# Patient Record
Sex: Female | Born: 1980 | Race: White | Hispanic: No | Marital: Single | Smoking: Former smoker
Health system: Southern US, Community
[De-identification: ages and names within clinical notes are randomized; demographics above are authoritative.]

## PROBLEM LIST (undated history)

## (undated) DIAGNOSIS — O139 Gestational [pregnancy-induced] hypertension without significant proteinuria, unspecified trimester: Secondary | ICD-10-CM

## (undated) HISTORY — DX: Gestational (pregnancy-induced) hypertension without significant proteinuria, unspecified trimester: O13.9

## (undated) HISTORY — PX: NO PAST SURGERIES: SHX2092

---

## 2001-03-12 ENCOUNTER — Other Ambulatory Visit: Admission: RE | Admit: 2001-03-12 | Discharge: 2001-03-12 | Payer: Self-pay | Admitting: Obstetrics and Gynecology

## 2002-06-02 ENCOUNTER — Other Ambulatory Visit: Admission: RE | Admit: 2002-06-02 | Discharge: 2002-06-02 | Payer: Self-pay | Admitting: Obstetrics and Gynecology

## 2003-03-30 ENCOUNTER — Other Ambulatory Visit: Admission: RE | Admit: 2003-03-30 | Discharge: 2003-03-30 | Payer: Self-pay | Admitting: Obstetrics and Gynecology

## 2004-03-27 ENCOUNTER — Other Ambulatory Visit: Admission: RE | Admit: 2004-03-27 | Discharge: 2004-03-27 | Payer: Self-pay | Admitting: Obstetrics and Gynecology

## 2009-10-16 ENCOUNTER — Emergency Department (HOSPITAL_COMMUNITY): Admission: EM | Admit: 2009-10-16 | Discharge: 2009-10-16 | Payer: Self-pay | Admitting: Emergency Medicine

## 2011-06-12 ENCOUNTER — Emergency Department (INDEPENDENT_AMBULATORY_CARE_PROVIDER_SITE_OTHER): Payer: Self-pay

## 2011-06-12 ENCOUNTER — Emergency Department (HOSPITAL_BASED_OUTPATIENT_CLINIC_OR_DEPARTMENT_OTHER)
Admission: EM | Admit: 2011-06-12 | Discharge: 2011-06-12 | Disposition: A | Discharge status: Home / Self Care | Payer: Self-pay | Attending: Emergency Medicine | Admitting: Emergency Medicine

## 2011-06-12 ENCOUNTER — Encounter: Payer: Self-pay | Admitting: *Deleted

## 2011-06-12 DIAGNOSIS — M25539 Pain in unspecified wrist: Secondary | ICD-10-CM

## 2011-06-12 DIAGNOSIS — S52043A Displaced fracture of coronoid process of unspecified ulna, initial encounter for closed fracture: Secondary | ICD-10-CM | POA: Insufficient documentation

## 2011-06-12 DIAGNOSIS — S52539A Colles' fracture of unspecified radius, initial encounter for closed fracture: Secondary | ICD-10-CM

## 2011-06-12 DIAGNOSIS — S52123A Displaced fracture of head of unspecified radius, initial encounter for closed fracture: Secondary | ICD-10-CM

## 2011-06-12 DIAGNOSIS — W19XXXA Unspecified fall, initial encounter: Secondary | ICD-10-CM | POA: Insufficient documentation

## 2011-06-12 DIAGNOSIS — R609 Edema, unspecified: Secondary | ICD-10-CM

## 2011-06-12 DIAGNOSIS — F17200 Nicotine dependence, unspecified, uncomplicated: Secondary | ICD-10-CM | POA: Insufficient documentation

## 2011-06-12 MED ORDER — HYDROCODONE-ACETAMINOPHEN 5-325 MG PO TABS: 2.0000 | ORAL_TABLET | ORAL | Status: AC | PRN

## 2011-06-12 NOTE — ED Notes (Signed)
Playing kickball last pm and fell backward. Inj to her right wrist and left elbow. Swelling and pain to her wrist and pain to her elbow. Has been taking Advil and using ice on affected sites.

## 2011-06-12 NOTE — ED Provider Notes (Signed)
History     CSN: [id number] Arrival date & time: 06/12/2011 11:52 AM   Chief Complaint  Patient presents with  . Wrist Pain     (Include location/radiation/quality/duration/timing/severity/associated sxs/prior treatment) Patient is a 30 y.o. female presenting with wrist pain.  Wrist Pain This is a new problem. The current episode started yesterday. The problem occurs constantly. The problem has been unchanged. Associated symptoms include joint swelling. Pertinent negatives include no numbness or weakness. The symptoms are aggravated by bending. She has tried immobilization for the symptoms. The treatment provided moderate relief.     History reviewed. No pertinent past medical history.   History reviewed. No pertinent past surgical history.  No family history on file.  History  Substance Use Topics  . Smoking status: Current Everyday Smoker -- 0.5 packs/day  . Smokeless tobacco: Not on file  . Alcohol Use: Yes    OB History    Grav Para Term Preterm Abortions TAB SAB Ect Mult Living                  Review of Systems  Musculoskeletal: Positive for joint swelling.  Neurological: Negative for weakness and numbness.  All other systems reviewed and are negative.    Allergies  Review of patient's allergies indicates no known allergies.  Home Medications  No current outpatient prescriptions on file.  Physical Exam    BP 127/72  Pulse 103  Temp(Src) 98.2 F (36.8 C) (Oral)  Resp 20  SpO2 100%  Physical Exam  Nursing note and vitals reviewed. Constitutional: She appears well-developed and well-nourished.  HENT:  Head: Normocephalic.  Eyes: Pupils are equal, round, and reactive to light.  Musculoskeletal: She exhibits edema and tenderness.       Decreased range of motion.  nv and ns intact  Neurological: She is alert.  Skin: Skin is warm and dry.  Psychiatric: She has a normal mood and affect.    ED Course  Procedures  No results found for this or  any previous visit. Dg Wrist Complete Right  06/12/2011  *RADIOLOGY REPORT*  Clinical Data: Fall, right wrist pain and swelling  RIGHT WRIST - COMPLETE 3+ VIEW  Comparison: None.  Findings: Mild right wrist soft tissue swelling.  Nondisplaced radiolucent fracture line visible of the right distal radius extending into the intra-articular surface.  Distal ulna and carpal bones appear intact.  IMPRESSION: Nondisplaced right distal radius intra-articular fracture.  Original Report Authenticated By: Judie Petit. Ruel Favors, M.D.     No diagnosis found.   MDM Pt counseled on fx.  I advised followup with Dr. Pearletha Forge for recheck.       Langston Masker, [place] 06/12/11 1353

## 2011-06-13 NOTE — ED Provider Notes (Signed)
History     CSN: [id number] Arrival date & time: 06/12/2011 11:52 AM   Chief Complaint  Patient presents with  . Wrist Pain     (Include location/radiation/quality/duration/timing/severity/associated sxs/prior treatment) HPI   History reviewed. No pertinent past medical history.   History reviewed. No pertinent past surgical history.  No family history on file.  History  Substance Use Topics  . Smoking status: Current Everyday Smoker -- 0.5 packs/day  . Smokeless tobacco: Not on file  . Alcohol Use: Yes    OB History    Grav Para Term Preterm Abortions TAB SAB Ect Mult Living                  Review of Systems  Allergies  Review of patient's allergies indicates no known allergies.  Home Medications   Current Outpatient Rx  Name Route Sig Dispense Refill  . HYDROCODONE-ACETAMINOPHEN 5-325 MG PO TABS Oral Take 2 tablets by mouth every 4 (four) hours as needed for pain. 20 tablet 0    Physical Exam    BP 127/72  Pulse 103  Temp(Src) 98.2 F (36.8 C) (Oral)  Resp 20  SpO2 100%  Physical Exam  ED Course  Procedures  No results found for this or any previous visit. Dg Wrist Complete Right  06/12/2011  *RADIOLOGY REPORT*  Clinical Data: Fall, right wrist pain and swelling  RIGHT WRIST - COMPLETE 3+ VIEW  Comparison: None.  Findings: Mild right wrist soft tissue swelling.  Nondisplaced radiolucent fracture line visible of the right distal radius extending into the intra-articular surface.  Distal ulna and carpal bones appear intact.  IMPRESSION: Nondisplaced right distal radius intra-articular fracture.  Original Report Authenticated By: Judie Petit. Ruel Favors, M.D.     1. Closed fracture of head of radius      MDM Medical screening examination/treatment/procedure(s) were performed by non-physician practitioner and as supervising physician I was immediately available for consultation/collaboration.        Yamilette Garretson A. Patrica Duel, MD 06/13/11 [phone]

## 2011-06-17 ENCOUNTER — Ambulatory Visit (INDEPENDENT_AMBULATORY_CARE_PROVIDER_SITE_OTHER): Payer: Self-pay | Admitting: Family Medicine

## 2011-06-17 ENCOUNTER — Encounter: Payer: Self-pay | Admitting: Family Medicine

## 2011-06-17 VITALS — BP 114/71 | HR 103 | Temp 98.2°F | Ht 70.0 in | Wt 130.0 lb

## 2011-06-17 DIAGNOSIS — S6991XA Unspecified injury of right wrist, hand and finger(s), initial encounter: Secondary | ICD-10-CM | POA: Insufficient documentation

## 2011-06-17 DIAGNOSIS — S59909A Unspecified injury of unspecified elbow, initial encounter: Secondary | ICD-10-CM

## 2011-06-17 DIAGNOSIS — S6990XA Unspecified injury of unspecified wrist, hand and finger(s), initial encounter: Secondary | ICD-10-CM

## 2011-06-17 HISTORY — DX: Unspecified injury of right wrist, hand and finger(s), initial encounter: S69.91XA

## 2011-06-17 NOTE — Assessment & Plan Note (Signed)
Nondisplaced intraarticular right distal radius fracture - fracture fragment involves approximately 25% of radiocarpal joint.  No ulnar component.  This is nondisplaced and if holds its current position without exceeding 3 mm displacement, she should heal well with conservative treatment.  Will continue with splinting for total of 10-11 days then will return this coming Monday for splint removal, repeat radiographs to reassess.  If still holding position will transition to short arm cast at that time.  Icing, tylenol during day for pain - rx tramadol for evenings (no driving on this) instead of hydrocodone.  Elevation for swelling.  F/u on Monday.

## 2011-06-17 NOTE — Progress Notes (Signed)
  Subjective:    Patient ID: Emily Mccarthy, female    DOB: 01/15/1981, 30 y.o.   MRN: [id number]  PCP: Dr. [facility]  HPI 30 yo F here for follow up of left wrist injury.  Patient states she was playing kickball on evening of 9/12. She caught the ball but fell backwards - sustained a FOOSH injury to right wrist with this extended behind her. Had some pain initially but kept playing. Then when picked ball up on next play and threw it, felt pain much worse in her right wrist and stopped playing. + swelling over next day. Went to ED on 9/13 and diagnosed with nondisplaced intraarticular distal radius fracture - place in volar splint and advised to follow-up here. Has been icing, elevating. Hydrocodone makes her too sleepy so stopped taking and instead taking advil. Is right handed  History reviewed. No pertinent past medical history.  Current Outpatient Prescriptions on File Prior to Visit  Medication Sig Dispense Refill  . HYDROcodone-acetaminophen (NORCO) 5-325 MG per tablet Take 2 tablets by mouth every 4 (four) hours as needed for pain.  20 tablet  0    History reviewed. No pertinent past surgical history.  No Known Allergies  History   Social History  . Marital Status: Single    Spouse Name: N/A    Number of Children: N/A  . Years of Education: N/A   Occupational History  . Not on file.   Social History Main Topics  . Smoking status: Current Everyday Smoker -- 0.5 packs/day  . Smokeless tobacco: Not on file  . Alcohol Use: Yes  . Drug Use: No  . Sexually Active:    Other Topics Concern  . Not on file   Social History Narrative  . No narrative on file    Family History  Problem Relation Age of Onset  . Heart attack Neg Hx   . Sudden death Neg Hx     BP 114/71  Pulse 103  Temp 98.2 F (36.8 C)  Ht 5\' 10"  (1.778 m)  Wt 130 lb (58.968 kg)  BMI 18.65 kg/m2  Review of Systems See HPI above.    Objective:   Physical Exam Gen: NAD R  wrist: Splint not removed for today's exam. Able to flex, extend, oppose thumb, abduct digits.  FROM elbow. Sensation intact to light touch and < 2 sec cap refill of digits. TTP distal radius.  No ulna or other TTP about wrist through splint.    Assessment & Plan:  1. Nondisplaced intraarticular right distal radius fracture - fracture fragment involves approximately 25% of radiocarpal joint.  No ulnar component.  This is nondisplaced and if holds its current position without exceeding 3 mm displacement, she should heal well with conservative treatment.  Will continue with splinting for total of 10-11 days then will return this coming Monday for splint removal, repeat radiographs to reassess.  If still holding position will transition to short arm cast at that time.  Icing, tylenol during day for pain - rx tramadol for evenings (no driving on this) instead of hydrocodone.  Elevation for swelling.  F/u on Monday.

## 2011-06-23 ENCOUNTER — Encounter: Payer: Self-pay | Admitting: Family Medicine

## 2011-06-23 ENCOUNTER — Ambulatory Visit (INDEPENDENT_AMBULATORY_CARE_PROVIDER_SITE_OTHER): Payer: Self-pay | Admitting: Family Medicine

## 2011-06-23 ENCOUNTER — Ambulatory Visit: Payer: Self-pay | Admitting: Family Medicine

## 2011-06-23 ENCOUNTER — Ambulatory Visit (HOSPITAL_BASED_OUTPATIENT_CLINIC_OR_DEPARTMENT_OTHER)
Admission: RE | Admit: 2011-06-23 | Discharge: 2011-06-23 | Disposition: A | Discharge status: Home / Self Care | Payer: Self-pay | Source: Ambulatory Visit | Attending: Family Medicine | Admitting: Family Medicine

## 2011-06-23 VITALS — BP 104/70 | HR 94 | Temp 98.3°F | Ht 70.0 in | Wt 130.0 lb

## 2011-06-23 DIAGNOSIS — M25529 Pain in unspecified elbow: Secondary | ICD-10-CM

## 2011-06-23 DIAGNOSIS — M25531 Pain in right wrist: Secondary | ICD-10-CM

## 2011-06-23 DIAGNOSIS — Z4789 Encounter for other orthopedic aftercare: Secondary | ICD-10-CM | POA: Insufficient documentation

## 2011-06-23 DIAGNOSIS — S6991XA Unspecified injury of right wrist, hand and finger(s), initial encounter: Secondary | ICD-10-CM

## 2011-06-23 DIAGNOSIS — M25522 Pain in left elbow: Secondary | ICD-10-CM

## 2011-06-23 DIAGNOSIS — M25539 Pain in unspecified wrist: Secondary | ICD-10-CM

## 2011-06-23 DIAGNOSIS — S6990XA Unspecified injury of unspecified wrist, hand and finger(s), initial encounter: Secondary | ICD-10-CM

## 2011-06-24 ENCOUNTER — Encounter: Payer: Self-pay | Admitting: Family Medicine

## 2011-06-24 DIAGNOSIS — M25522 Pain in left elbow: Secondary | ICD-10-CM | POA: Insufficient documentation

## 2011-06-24 HISTORY — DX: Pain in left elbow: M25.522

## 2011-06-24 NOTE — Assessment & Plan Note (Signed)
2/2 lateral epicondylitis.  Offered wrist brace, counterforce brace - she would like to wait on this and physical therapy.  Will reassess at followup.  Continue icing, tylenol/tramadol

## 2011-06-24 NOTE — Assessment & Plan Note (Signed)
Nondisplaced intraarticular right distal radius fracture - fracture fragment involves approximately 25% of radiocarpal joint.  No ulnar component.  Repeat radiographs show this is holding excellent position.  Transition today to short arm cast and anticipate total of 6 weeks of immobilization.  F/u in 2 weeks to remove cast, repeat x-rays.  Continue icing, tylenol during day for pain - rx tramadol for evenings (no driving on this). Elevation for swelling.

## 2011-06-24 NOTE — Progress Notes (Signed)
Subjective:    Patient ID: Emily Mccarthy, female    DOB: November 12, 1980, 30 y.o.   MRN: [id number]  PCP: Dr. [facility]  HPI  30 yo F here for follow up of left wrist injury.  9/18: Patient states she was playing kickball on evening of 9/12. She caught the ball but fell backwards - sustained a FOOSH injury to right wrist with this extended behind her. Had some pain initially but kept playing. Then when picked ball up on next play and threw it, felt pain much worse in her right wrist and stopped playing. + swelling over next day. Went to ED on 9/13 and diagnosed with nondisplaced intraarticular distal radius fracture - place in volar splint and advised to follow-up here. Has been icing, elevating. Hydrocodone makes her too sleepy so stopped taking and instead taking advil. Is right handed  9/24: Patient has done well with splint. Has been icing and now with very little pain in right arm. From using left arm more, is now getting lateral forearm and elbow pain without bruising or swelling. Pain worse with wrist extension. Has been icing this as well. No bruising lateral elbow - has some medially from her initial fall a couple weeks ago that is almost resolved. Takes tramadol in evenings as needed for pain.  History reviewed. No pertinent past medical history.  No current outpatient prescriptions on file prior to visit.    History reviewed. No pertinent past surgical history.  No Known Allergies  History   Social History  . Marital Status: Single    Spouse Name: N/A    Number of Children: N/A  . Years of Education: N/A   Occupational History  . Not on file.   Social History Main Topics  . Smoking status: Current Everyday Smoker -- 0.5 packs/day  . Smokeless tobacco: Not on file  . Alcohol Use: Yes  . Drug Use: No  . Sexually Active:    Other Topics Concern  . Not on file   Social History Narrative  . No narrative on file    Family History  Problem  Relation Age of Onset  . Heart attack Neg Hx   . Sudden death Neg Hx     BP 104/70  Pulse 94  Temp(Src) 98.3 F (36.8 C) (Oral)  Ht 5\' 10"  (1.778 m)  Wt 130 lb (58.968 kg)  BMI 18.65 kg/m2  Review of Systems  See HPI above.    Objective:   Physical Exam  Gen: NAD R wrist: Splint removed. Mild TTP distal radius.  No ulna or other TTP about right wrist/forearm/elbow. Did not test wrist motion 2/2 known fracture -aAble to flex, extend, oppose thumb, abduct digits.  FROM elbow. Sensation intact to light touch and < 2 sec cap refill of digits.  L elbow: No gross deformity, swelling, bruising laterally (small bruise medial upper arm). TTP just distal to lateral epicondyle and within extensor mass forearm.  No bony TTP. FROM elbow and wrist - pain on elbow and wrist extension near elbow. Collateral ligaments intact NVI distally.    Assessment & Plan:  1. Nondisplaced intraarticular right distal radius fracture - fracture fragment involves approximately 25% of radiocarpal joint.  No ulnar component.  Repeat radiographs show this is holding excellent position.  Transition today to short arm cast and anticipate total of 6 weeks of immobilization.  F/u in 2 weeks to remove cast, repeat x-rays.  Continue icing, tylenol during day for pain - rx tramadol  for evenings (no driving on this). Elevation for swelling.   2. Left elbow pain - 2/2 lateral epicondylitis.  Offered wrist brace, counterforce brace - she would like to wait on this and physical therapy.  Will reassess at followup.  Continue icing, tylenol/tramadol

## 2011-07-07 ENCOUNTER — Ambulatory Visit (INDEPENDENT_AMBULATORY_CARE_PROVIDER_SITE_OTHER): Payer: Self-pay | Admitting: Family Medicine

## 2011-07-07 ENCOUNTER — Ambulatory Visit (HOSPITAL_BASED_OUTPATIENT_CLINIC_OR_DEPARTMENT_OTHER)
Admission: RE | Admit: 2011-07-07 | Discharge: 2011-07-07 | Disposition: A | Discharge status: Home / Self Care | Payer: BC Managed Care – PPO | Source: Ambulatory Visit | Attending: Family Medicine | Admitting: Family Medicine

## 2011-07-07 ENCOUNTER — Encounter: Payer: Self-pay | Admitting: Family Medicine

## 2011-07-07 VITALS — BP 107/73 | HR 98 | Temp 98.0°F | Ht 70.0 in | Wt 130.0 lb

## 2011-07-07 DIAGNOSIS — S6990XA Unspecified injury of unspecified wrist, hand and finger(s), initial encounter: Secondary | ICD-10-CM

## 2011-07-07 DIAGNOSIS — M25531 Pain in right wrist: Secondary | ICD-10-CM

## 2011-07-07 DIAGNOSIS — Z4789 Encounter for other orthopedic aftercare: Secondary | ICD-10-CM | POA: Insufficient documentation

## 2011-07-07 DIAGNOSIS — S5290XD Unspecified fracture of unspecified forearm, subsequent encounter for closed fracture with routine healing: Secondary | ICD-10-CM

## 2011-07-07 DIAGNOSIS — M25539 Pain in unspecified wrist: Secondary | ICD-10-CM

## 2011-07-07 DIAGNOSIS — S59909A Unspecified injury of unspecified elbow, initial encounter: Secondary | ICD-10-CM

## 2011-07-07 DIAGNOSIS — S6991XA Unspecified injury of right wrist, hand and finger(s), initial encounter: Secondary | ICD-10-CM

## 2011-07-08 ENCOUNTER — Encounter: Payer: Self-pay | Admitting: Family Medicine

## 2011-07-08 MED ORDER — TRAMADOL HCL 50 MG PO TABS: 50.0000 mg | ORAL_TABLET | Freq: Four times a day (QID) | ORAL | Status: DC | PRN

## 2011-07-08 NOTE — Progress Notes (Signed)
Subjective:    Patient ID: Emily Mccarthy, female    DOB: 12-16-1980, 30 y.o.   MRN: [id number]  PCP: Dr. [facility]  HPI  30 yo F here for follow up of left wrist injury.  9/18: Patient states she was playing kickball on evening of 9/12. She caught the ball but fell backwards - sustained a FOOSH injury to right wrist with this extended behind her. Had some pain initially but kept playing. Then when picked ball up on next play and threw it, felt pain much worse in her right wrist and stopped playing. + swelling over next day. Went to ED on 9/13 and diagnosed with nondisplaced intraarticular distal radius fracture - place in volar splint and advised to follow-up here. Has been icing, elevating. Hydrocodone makes her too sleepy so stopped taking and instead taking advil. Is right handed  9/24: Patient has done well with splint. Has been icing and now with very little pain in right arm. From using left arm more, is now getting lateral forearm and elbow pain without bruising or swelling. Pain worse with wrist extension. Has been icing this as well. No bruising lateral elbow - has some medially from her initial fall a couple weeks ago that is almost resolved. Takes tramadol in evenings as needed for pain.  10/8: Patient reports she is not having any pain currently in right wrist. Still with pain left elbow mostly in mornings when wakes up - improves throughout day and better than last visit. Is out of tramadol. Cast has held up well - some itching at thumb. Would like to wait on bracing for left or physical therapy.  History reviewed. No pertinent past medical history.  Current Outpatient Prescriptions on File Prior to Visit  Medication Sig Dispense Refill  . traMADol (ULTRAM) 50 MG tablet Take 50 mg by mouth every 6 (six) hours as needed.          History reviewed. No pertinent past surgical history.  No Known Allergies  History   Social History  . Marital  Status: Single    Spouse Name: N/A    Number of Children: N/A  . Years of Education: N/A   Occupational History  . Not on file.   Social History Main Topics  . Smoking status: Current Everyday Smoker -- 0.5 packs/day  . Smokeless tobacco: Not on file  . Alcohol Use: Yes  . Drug Use: No  . Sexually Active: Not on file   Other Topics Concern  . Not on file   Social History Narrative  . No narrative on file    Family History  Problem Relation Age of Onset  . Heart attack Neg Hx   . Sudden death Neg Hx     BP 107/73  Pulse 98  Temp(Src) 98 F (36.7 C) (Oral)  Ht 5\' 10"  (1.778 m)  Wt 130 lb (58.968 kg)  BMI 18.65 kg/m2  Review of Systems  See HPI above.    Objective:   Physical Exam  Gen: NAD R wrist: Cast removed. No TTP distal radius.  No ulna or other TTP about right wrist/forearm/elbow. Did not test wrist motion 2/2 known fracture -Able to flex, extend, oppose thumb, abduct digits.  FROM elbow. Sensation intact to light touch and < 2 sec cap refill of digits.  L elbow (not reexamined today): No gross deformity, swelling, bruising laterally (small bruise medial upper arm). TTP just distal to lateral epicondyle and within extensor mass forearm.  No  bony TTP. FROM elbow and wrist - pain on elbow and wrist extension near elbow. Collateral ligaments intact NVI distally.    Assessment & Plan:  1. Nondisplaced intraarticular right distal radius fracture - no displacement on repeat x-rays and healing very well.  Clinically no pain.  Will continue with casting for another 10-14 days (will be 5 weeks out at that point) then f/u to repeat x-rays.  If continues to show excellent healing and no tenderness at follow-up visit, will discontinue immobilization, possibly consider wrist brace.  Icing, tylenol, tramadol as needed.  2. Left elbow pain - 2/2 lateral epicondylitis.  Offered wrist brace, counterforce brace - she would still like to wait on this and physical  therapy.  Will reassess at followup.  Continue icing, tylenol/tramadol

## 2011-07-17 ENCOUNTER — Ambulatory Visit (HOSPITAL_BASED_OUTPATIENT_CLINIC_OR_DEPARTMENT_OTHER)
Admission: RE | Admit: 2011-07-17 | Discharge: 2011-07-17 | Disposition: A | Discharge status: Home / Self Care | Payer: BC Managed Care – PPO | Source: Ambulatory Visit | Attending: Family Medicine | Admitting: Family Medicine

## 2011-07-17 ENCOUNTER — Encounter: Payer: Self-pay | Admitting: Family Medicine

## 2011-07-17 ENCOUNTER — Ambulatory Visit (INDEPENDENT_AMBULATORY_CARE_PROVIDER_SITE_OTHER): Payer: BC Managed Care – PPO | Admitting: Family Medicine

## 2011-07-17 VITALS — BP 103/76 | HR 116 | Temp 98.5°F | Ht 70.0 in | Wt 130.0 lb

## 2011-07-17 DIAGNOSIS — Z4789 Encounter for other orthopedic aftercare: Secondary | ICD-10-CM | POA: Insufficient documentation

## 2011-07-17 DIAGNOSIS — M25539 Pain in unspecified wrist: Secondary | ICD-10-CM

## 2011-07-17 DIAGNOSIS — M25531 Pain in right wrist: Secondary | ICD-10-CM

## 2011-07-17 DIAGNOSIS — S6991XA Unspecified injury of right wrist, hand and finger(s), initial encounter: Secondary | ICD-10-CM

## 2011-07-17 DIAGNOSIS — S6990XA Unspecified injury of unspecified wrist, hand and finger(s), initial encounter: Secondary | ICD-10-CM

## 2011-07-17 DIAGNOSIS — M25529 Pain in unspecified elbow: Secondary | ICD-10-CM

## 2011-07-17 DIAGNOSIS — S5290XD Unspecified fracture of unspecified forearm, subsequent encounter for closed fracture with routine healing: Secondary | ICD-10-CM

## 2011-07-17 DIAGNOSIS — M25522 Pain in left elbow: Secondary | ICD-10-CM

## 2011-07-18 ENCOUNTER — Encounter: Payer: Self-pay | Admitting: Family Medicine

## 2011-07-18 NOTE — Patient Instructions (Signed)
Patient instructed to follow up as needed and call us if she would like to go forward with treatment of left tennis elbow.

## 2011-07-18 NOTE — Assessment & Plan Note (Signed)
Nondisplaced intraarticular right distal radius fracture - Patient > 5 weeks out with excellent healing of fracture, no displacement.  At this point will transition to wrist brace for 1 more week then only with activities for 2 weeks.  Will f/u with [place] as needed.  Icing, tylenol, tramadol as needed.

## 2011-07-18 NOTE — Progress Notes (Signed)
Subjective:    Patient ID: Emily Mccarthy, female    DOB: [birth year], 30 y.o.   MRN: [id number]  PCP: Dr. [facility]  HPI  30 yo F here for follow up of left wrist injury.  9/18: Patient states she was playing kickball on evening of 9/12. She caught the ball but fell backwards - sustained a FOOSH injury to right wrist with this extended behind her. Had some pain initially but kept playing. Then when picked ball up on next play and threw it, felt pain much worse in her right wrist and stopped playing. + swelling over next day. Went to ED on 9/13 and diagnosed with nondisplaced intraarticular distal radius fracture - place in volar splint and advised to follow-up here. Has been icing, elevating. Hydrocodone makes her too sleepy so stopped taking and instead taking advil. Is right handed  9/24: Patient has done well with splint. Has been icing and now with very little pain in right arm. From using left arm more, is now getting lateral forearm and elbow pain without bruising or swelling. Pain worse with wrist extension. Has been icing this as well. No bruising lateral elbow - has some medially from her initial fall a couple weeks ago that is almost resolved. Takes tramadol in evenings as needed for pain.  10/8: Patient reports she is not having any pain currently in right wrist. Still with pain left elbow mostly in mornings when wakes up - improves throughout day and better than last visit. Is out of tramadol. Cast has held up well - some itching at thumb. Would like to wait on bracing for left or physical therapy.  10/18: Patient now > 5 weeks out from distal radius fracture. Reports no pain. Left elbow is improving as well - she continues to want to wait on bracing or PT for this - believes after coming out of the cast she'll be able to use right hand more and pain in left side will improve. Taking tramadol for left elbow. Doing well with cast.  History reviewed. No  pertinent past medical history.  Current Outpatient Prescriptions on File Prior to Visit  Medication Sig Dispense Refill  . traMADol (ULTRAM) 50 MG tablet Take 1 tablet (50 mg total) by mouth every 6 (six) hours as needed.  60 tablet  0    History reviewed. No pertinent past surgical history.  No Known Allergies  History   Social History  . Marital Status: Single    Spouse Name: N/A    Number of Children: N/A  . Years of Education: N/A   Occupational History  . Not on file.   Social History Main Topics  . Smoking status: Current Everyday Smoker -- 0.5 packs/day  . Smokeless tobacco: Not on file  . Alcohol Use: Yes  . Drug Use: No  . Sexually Active: Not on file   Other Topics Concern  . Not on file   Social History Narrative  . No narrative on file    Family History  Problem Relation Age of Onset  . Heart attack Neg Hx   . Sudden death Neg Hx     BP 103/76  Pulse 116  Temp(Src) 98.5 F (36.9 C) (Oral)  Ht 5\' 10"  (1.778 m)  Wt 130 lb (58.968 kg)  BMI 18.65 kg/m2  Review of Systems  See HPI above.    Objective:   Physical Exam  Gen: NAD R wrist: Cast removed. No TTP distal radius.  No ulna  or other TTP about right wrist/forearm/elbow. Stiffness and decreased motion with flexion and extension of wrist - Able to flex, extend, oppose thumb, abduct digits.  FROM elbow. Sensation intact to light touch and < 2 sec cap refill of digits.     Assessment & Plan:  1. Nondisplaced intraarticular right distal radius fracture - Patient > 5 weeks out with excellent healing of fracture, no displacement.  At this point will transition to wrist brace for 1 more week then only with activities for 2 weeks.  Will f/u with [place] as needed.  Icing, tylenol, tramadol as needed.  2. Left elbow pain - 2/2 lateral epicondylitis.  Offered wrist brace, counterforce brace - she would still like to wait on this and physical therapy.  Will call us if she would like to proceed with  either of these.

## 2011-07-18 NOTE — Assessment & Plan Note (Signed)
2/2 lateral epicondylitis.  Offered wrist brace, counterforce brace - she would still like to wait on this and physical therapy.  Will call us if she would like to proceed with either of these.

## 2011-08-27 NOTE — ED Provider Notes (Signed)
Medical screening examination/treatment/procedure(s) were performed by non-physician practitioner and as supervising physician I was immediately available for consultation/collaboration.   Theon Sobotka A. Patrica Duel, MD 08/27/11 [phone]

## 2011-11-08 ENCOUNTER — Ambulatory Visit (INDEPENDENT_AMBULATORY_CARE_PROVIDER_SITE_OTHER): Payer: BC Managed Care – PPO | Admitting: Internal Medicine

## 2011-11-08 VITALS — BP 134/71 | HR 125 | Temp 98.8°F | Resp 16 | Ht 69.25 in | Wt 130.4 lb

## 2011-11-08 DIAGNOSIS — F988 Other specified behavioral and emotional disorders with onset usually occurring in childhood and adolescence: Secondary | ICD-10-CM

## 2011-11-08 MED ORDER — AMPHETAMINE-DEXTROAMPHETAMINE 30 MG PO TABS: 30.0000 mg | ORAL_TABLET | Freq: Two times a day (BID) | ORAL | Status: DC

## 2011-11-09 NOTE — Progress Notes (Signed)
  Subjective:    Patient ID: Emily Mccarthy, female    DOB: [birth year], 31 y.o.   MRN: [id number]  HPIthis 31 year old female works full-time in [employer]. Her diagnosis of ADD was made before middle school. She's been on various medicines since then. She is currently being followed by Dr. Serita Kyle and treated with Adderall on a daily basis. She has no side effects from the medication  She needs a med refill because she could not get back to see him in time  Review of Systems     Objective:   Physical Exam nneck is supple without thyromegaly The heart has a regular rhythm without murmurs rubs gallops or clicks Neurological is intact       Assessment & Plan:   #1 ADD--Adderall 30 mg twice a day #60 Followup with Dr. Yetta Barre

## 2012-04-03 IMAGING — CR DG WRIST COMPLETE 3+V*R*
4 series · 4 of 4 positions shown · non-contrast
Comparison: Multiple prior x-rays.

CLINICAL DATA: Follow-up fracture.

RIGHT WRIST - COMPLETE 3+ VIEW

[x wrist pa right]
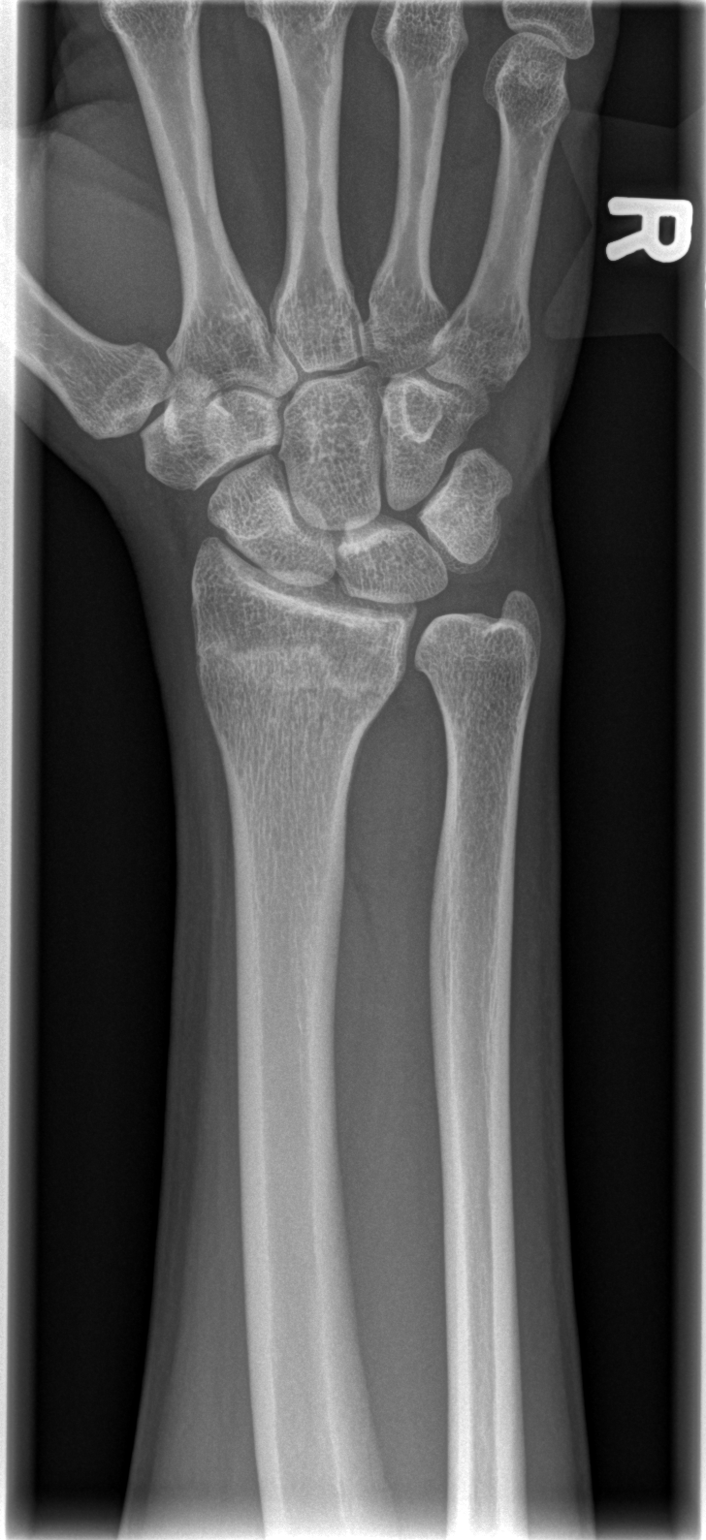

[x wrist obl right]
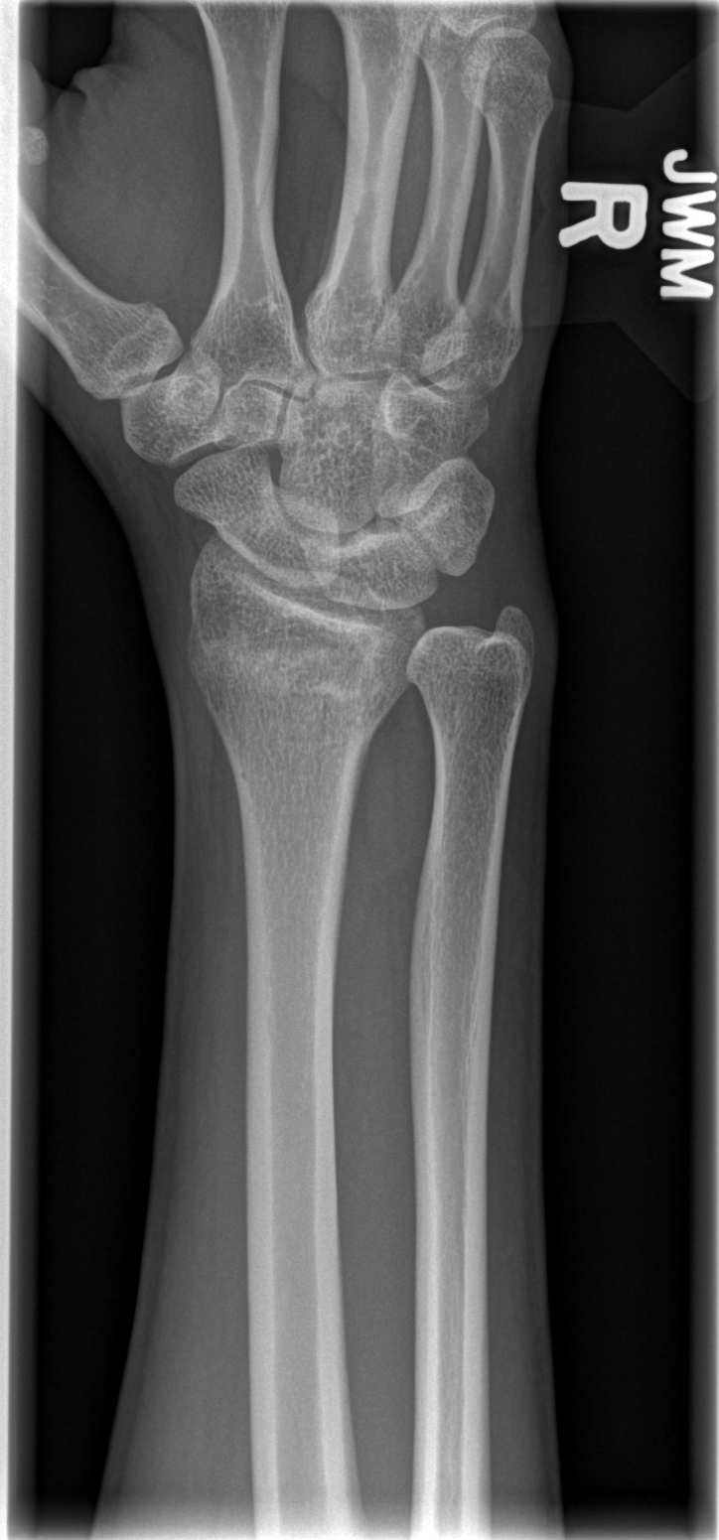

[x wrist lat right]
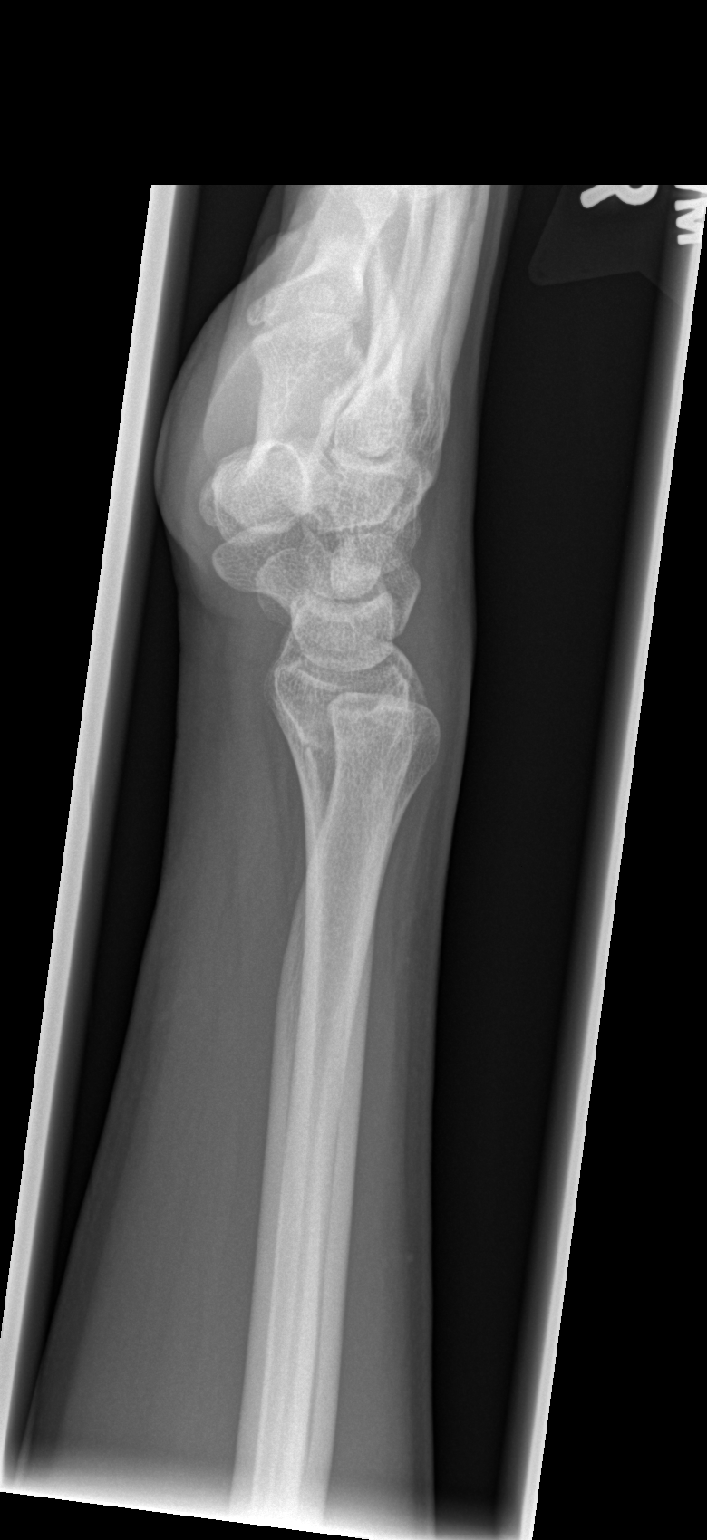

[x navicular]
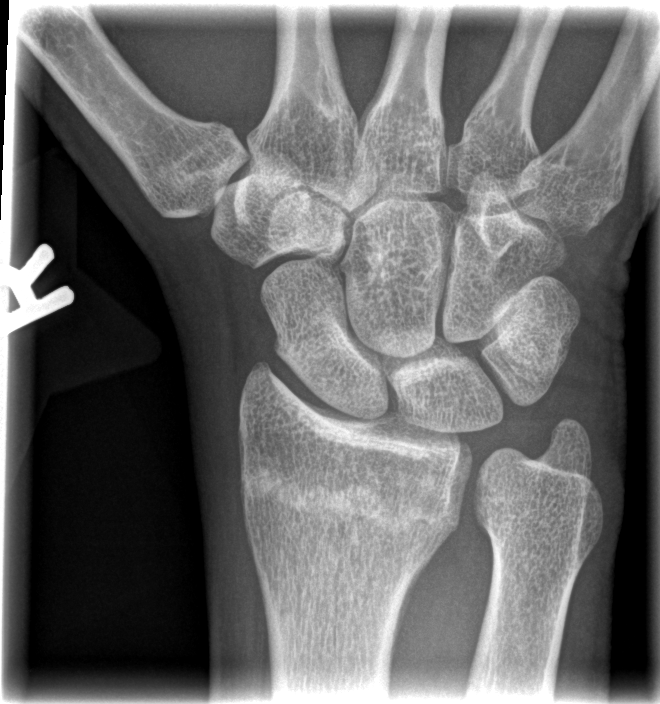

[4 of 4 positions shown; findings below may reference images not displayed]

FINDINGS: Further healing changes involving the distal radius
fracture with bony ingrowth.  The joint spaces are maintained.
IMPRESSION: Further healing changes.

## 2012-05-08 ENCOUNTER — Ambulatory Visit (INDEPENDENT_AMBULATORY_CARE_PROVIDER_SITE_OTHER): Payer: BC Managed Care – PPO | Admitting: Emergency Medicine

## 2012-05-08 VITALS — BP 112/68 | HR 103 | Temp 98.3°F | Resp 16 | Ht 69.5 in | Wt 131.2 lb

## 2012-05-08 DIAGNOSIS — F988 Other specified behavioral and emotional disorders with onset usually occurring in childhood and adolescence: Secondary | ICD-10-CM

## 2012-05-08 MED ORDER — AMPHETAMINE-DEXTROAMPHETAMINE 30 MG PO TABS: 30.0000 mg | ORAL_TABLET | Freq: Two times a day (BID) | ORAL | Status: DC

## 2012-05-08 NOTE — Progress Notes (Signed)
   Date:  05/08/2012   Name:  Emily Mccarthy   DOB:  06/28/1981   MRN:  [id number] Gender: female  Age: 31 y.o.  PCP:  No primary provider on file.    Chief Complaint: Medication Refill   History of Present Illness:  Emily Mccarthy is a 31 y.o. pleasant patient who presents with the following:  History of ADD by chart.  Seen previously in February and had Rx filled when she couldn't see her physician.  Here because she is going on vacation and can't see her primary care MD for refill.  Patient Active Problem List  Diagnosis  . Right wrist injury  . Left elbow pain    No past medical history on file.  No past surgical history on file.  History  Substance Use Topics  . Smoking status: Current Everyday Smoker -- 0.5 packs/day  . Smokeless tobacco: Not on file  . Alcohol Use: Yes    Family History  Problem Relation Age of Onset  . Heart attack Neg Hx   . Sudden death Neg Hx     No Known Allergies  Medication list has been reviewed and updated.  Current Outpatient Prescriptions on File Prior to Visit  Medication Sig Dispense Refill  . amphetamine-dextroamphetamine (ADDERALL) 30 MG tablet Take 1 tablet (30 mg total) by mouth 2 (two) times daily.  60 tablet  0  . NUVARING 0.12-0.015 MG/24HR vaginal ring       . traMADol (ULTRAM) 50 MG tablet Take 1 tablet (50 mg total) by mouth every 6 (six) hours as needed.  60 tablet  0    Review of Systems:  As per HPI, otherwise negative.    Physical Examination: Filed Vitals:   05/08/12 1620  BP: 112/68  Pulse: 103  Temp: 98.3 F (36.8 C)  Resp: 16   Filed Vitals:   05/08/12 1620  Height: 5' 9.5" (1.765 m)  Weight: 131 lb 3.2 oz (59.512 kg)   Body mass index is 19.10 kg/(m^2). Ideal Body Weight: Weight in (lb) to have BMI = 25: 171.4    GEN: WDWN, NAD, Non-toxic, Alert & Oriented x 3 HEENT: Atraumatic, Normocephalic.  Ears and Nose: No external deformity. EXTR: No clubbing/cyanosis/edema NEURO: Normal gait.    PSYCH: Normally interactive. Conversant. Not depressed or anxious appearing.  Calm demeanor.  Chest BS= CTA  Assessment and Plan: ADD Refill adderall one month only Follow up with FMD  Carmelina Dane, MD

## 2012-05-08 NOTE — Progress Notes (Deleted)
  Subjective:    Patient ID: Emily Mccarthy, female    DOB: 01/23/1981, 31 y.o.   MRN: [id number]  HPI    Review of Systems     Objective:   Physical Exam        Assessment & Plan:

## 2012-06-13 ENCOUNTER — Ambulatory Visit (INDEPENDENT_AMBULATORY_CARE_PROVIDER_SITE_OTHER): Payer: BC Managed Care – PPO | Admitting: Internal Medicine

## 2012-06-13 ENCOUNTER — Encounter: Payer: Self-pay | Admitting: Internal Medicine

## 2012-06-13 VITALS — BP 96/66 | HR 97 | Temp 98.2°F | Resp 16 | Ht 69.5 in | Wt 129.6 lb

## 2012-06-13 DIAGNOSIS — F17200 Nicotine dependence, unspecified, uncomplicated: Secondary | ICD-10-CM | POA: Insufficient documentation

## 2012-06-13 DIAGNOSIS — F988 Other specified behavioral and emotional disorders with onset usually occurring in childhood and adolescence: Secondary | ICD-10-CM

## 2012-06-13 HISTORY — DX: Nicotine dependence, unspecified, uncomplicated: F17.200

## 2012-06-13 MED ORDER — AMPHETAMINE-DEXTROAMPHETAMINE 30 MG PO TABS: 30.0000 mg | ORAL_TABLET | Freq: Two times a day (BID) | ORAL | Status: DC
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
## 2012-06-14 NOTE — Progress Notes (Signed)
Appt made for 09/01/12, pt notified. Lennon Alstrom

## 2012-08-13 ENCOUNTER — Telehealth: Payer: Self-pay
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
## 2012-08-13 NOTE — Telephone Encounter (Signed)
Dr Merla Riches, pt has appt scheduled w/you on 09/01/12. Do you want to rewrite this Rx?

## 2012-08-14 MED ORDER — AMPHETAMINE-DEXTROAMPHETAMINE 30 MG PO TABS: 30.0000 mg | ORAL_TABLET | Freq: Two times a day (BID) | ORAL | Status: DC
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
## 2012-08-15 ENCOUNTER — Telehealth: Payer: Self-pay

## 2012-08-15 NOTE — Telephone Encounter (Signed)
Called pt to advise Rx ready for pick up. LMOM.

## 2012-09-01 ENCOUNTER — Ambulatory Visit: Payer: BC Managed Care – PPO | Admitting: Internal Medicine

## 2012-09-08 ENCOUNTER — Ambulatory Visit (INDEPENDENT_AMBULATORY_CARE_PROVIDER_SITE_OTHER): Payer: BC Managed Care – PPO | Admitting: Internal Medicine

## 2012-09-08 ENCOUNTER — Encounter: Payer: Self-pay | Admitting: Internal Medicine

## 2012-09-08 VITALS — BP 90/64 | HR 104 | Temp 98.1°F | Resp 16 | Ht 69.5 in | Wt 135.8 lb

## 2012-09-08 DIAGNOSIS — F988 Other specified behavioral and emotional disorders with onset usually occurring in childhood and adolescence: Secondary | ICD-10-CM

## 2012-09-08 DIAGNOSIS — F17200 Nicotine dependence, unspecified, uncomplicated: Secondary | ICD-10-CM

## 2012-09-08 DIAGNOSIS — Z23 Encounter for immunization: Secondary | ICD-10-CM

## 2012-09-08 MED ORDER — AMPHETAMINE-DEXTROAMPHETAMINE 30 MG PO TABS: 30.0000 mg | ORAL_TABLET | Freq: Two times a day (BID) | ORAL | Status: DC

## 2012-09-08 NOTE — Progress Notes (Signed)
  Subjective:    Patient ID: Emily Mccarthy, female    DOB: 1980-10-16, 31 y.o.   MRN: [id number]  HPIhere for F/U Patient Active Problem List  Diagnosis        . Attention deficit disorder-responding to medications without any side effects /  . Nicotine addiction---better/down to 8 per day--doesn't want chantix due to side effects    -  Reactive depr--loss of Dad/end of relat//is doing well now has completed counseling      Review of Systems Denies headache, vision changes, tremors, palpitations, chest pain, insomnia, weight loss and irritability    Objective:   Physical Exam Vital signs stable Weight stable Pupils equal round reactive to light and accommodation/EOMs conjugate Cranial nerves II through XII intact Mood is good/affect stable       Assessment & Plan:   Problem #1 ADD Meds ordered this encounter  Medications  . amphetamine-dextroamphetamine (ADDERALL) 30 MG tablet    Sig: Take 1 tablet (30 mg total) by mouth 2 (two) times daily.    Dispense:  60 tablet    Refill:  0  . amphetamine-dextroamphetamine (ADDERALL) 30 MG tablet    Sig: Take 1 tablet (30 mg total) by mouth 2 (two) times daily. For after 10/08/12    Dispense:  60 tablet    Refill:  0  . amphetamine-dextroamphetamine (ADDERALL) 30 MG tablet    Sig: Take 1 tablet (30 mg total) by mouth 2 (two) times daily. For after 11/08/12    Dispense:  60 tablet    Refill:  0   May call in 3 months for 3 more prescriptions and followup in 6 months if all is stable  Problem #2 nicotine addiction Discussed gradual cessation method to get from 8-0 Discussed replacement activities  Problem #3 reactive depression-now resolving/discussed diary

## 2012-12-30 ENCOUNTER — Telehealth: Payer: Self-pay

## 2012-12-30 DIAGNOSIS — F988 Other specified behavioral and emotional disorders with onset usually occurring in childhood and adolescence: Secondary | ICD-10-CM

## 2012-12-30 MED ORDER — AMPHETAMINE-DEXTROAMPHETAMINE 30 MG PO TABS: 30.0000 mg | ORAL_TABLET | Freq: Two times a day (BID) | ORAL | Status: DC

## 2012-12-30 NOTE — Telephone Encounter (Signed)
Patient advised these are ready for pick up

## 2012-12-30 NOTE — Telephone Encounter (Signed)
Please advise 

## 2012-12-30 NOTE — Telephone Encounter (Signed)
Ok  Meds ordered this encounter  Medications  . amphetamine-dextroamphetamine (ADDERALL) 30 MG tablet    Sig: Take 1 tablet (30 mg total) by mouth 2 (two) times daily.    Dispense:  60 tablet    Refill:  0  . amphetamine-dextroamphetamine (ADDERALL) 30 MG tablet    Sig: Take 1 tablet (30 mg total) by mouth 2 (two) times daily. For after 03/01/13    Dispense:  60 tablet    Refill:  0  . amphetamine-dextroamphetamine (ADDERALL) 30 MG tablet    Sig: Take 1 tablet (30 mg total) by mouth 2 (two) times daily. For after 01/29/13    Dispense:  60 tablet    Refill:  0

## 2012-12-30 NOTE — Telephone Encounter (Signed)
PT IN NEED OF HER ADDERALL. PLEASE CALL [phone]

## 2013-04-20 ENCOUNTER — Telehealth: Payer: Self-pay

## 2013-04-20 NOTE — Telephone Encounter (Signed)
Pt would like to request a rx refill on adderall. Best# [phone]

## 2013-04-21 NOTE — Telephone Encounter (Signed)
Called again spoke to patient she states she can come in tomorrow.

## 2013-04-21 NOTE — Telephone Encounter (Signed)
What is her follow up plan? She was due for follow up last month. Called her.  Left message for her to call me back.

## 2013-04-21 NOTE — Telephone Encounter (Signed)
Amy,    Please call again   [phone]

## 2013-04-22 ENCOUNTER — Encounter (INDEPENDENT_AMBULATORY_CARE_PROVIDER_SITE_OTHER): Payer: BC Managed Care – PPO

## 2013-04-24 ENCOUNTER — Ambulatory Visit (INDEPENDENT_AMBULATORY_CARE_PROVIDER_SITE_OTHER): Payer: BC Managed Care – PPO | Admitting: Internal Medicine

## 2013-04-24 VITALS — BP 98/68 | HR 99 | Temp 98.2°F | Resp 16 | Ht 70.0 in | Wt 133.0 lb

## 2013-04-24 DIAGNOSIS — F988 Other specified behavioral and emotional disorders with onset usually occurring in childhood and adolescence: Secondary | ICD-10-CM

## 2013-04-24 DIAGNOSIS — F17200 Nicotine dependence, unspecified, uncomplicated: Secondary | ICD-10-CM

## 2013-04-24 MED ORDER — AMPHETAMINE-DEXTROAMPHETAMINE 30 MG PO TABS: 30.0000 mg | ORAL_TABLET | Freq: Two times a day (BID) | ORAL | Status: DC

## 2013-04-24 NOTE — Progress Notes (Signed)
  Subjective:    Patient ID: Emily Mccarthy, female    DOB: [birth year], 32 y.o.   MRN: [id number]  HPI F/u ADD-stable on meds w/out Side effects Down to 2 cigs/d Has a new job offer-works in Airline pilot  Review of Systems Noncontributory    Objective:   Physical Exam BP 98/68  Pulse 99  Temp(Src) 98.2 F (36.8 C) (Oral)  Resp 16  Ht 5\' 10"  (1.778 m)  Wt 133 lb (60.328 kg)  BMI 19.08 kg/m2  SpO2 100%  LMP 04/05/2013 HEENT clear Neurological intact Mood good affect appropriate       Assessment & Plan:  Attention deficit disorder  Meds ordered this encounter  Medications  . amphetamine-dextroamphetamine (ADDERALL) 30 MG tablet    Sig: Take 1 tablet (30 mg total) by mouth 2 (two) times daily.    Dispense:  60 tablet    Refill:  0  . amphetamine-dextroamphetamine (ADDERALL) 30 MG tablet    Sig: Take 1 tablet (30 mg total) by mouth 2 (two) times daily. For after 05/25/13    Dispense:  60 tablet    Refill:  0  . amphetamine-dextroamphetamine (ADDERALL) 30 MG tablet    Sig: Take 1 tablet (30 mg total) by mouth 2 (two) times daily. For after 05/2713    Dispense:  60 tablet    Refill:  0   Followup in 6 months/call in 3 months

## 2013-04-29 NOTE — Progress Notes (Signed)
This encounter was created in error - please disregard.

## 2013-07-28 ENCOUNTER — Telehealth: Payer: Self-pay

## 2013-07-28 DIAGNOSIS — F988 Other specified behavioral and emotional disorders with onset usually occurring in childhood and adolescence: Secondary | ICD-10-CM

## 2013-07-28 NOTE — Telephone Encounter (Signed)
Pt needs a refill of adderall, Please call [phone]

## 2013-07-29 MED ORDER — AMPHETAMINE-DEXTROAMPHETAMINE 30 MG PO TABS: 30.0000 mg | ORAL_TABLET | Freq: Two times a day (BID) | ORAL | Status: DC

## 2013-07-29 NOTE — Telephone Encounter (Signed)
Patient advised.

## 2013-07-29 NOTE — Telephone Encounter (Signed)
Meds ordered this encounter  Medications  . amphetamine-dextroamphetamine (ADDERALL) 30 MG tablet    Sig: Take 1 tablet (30 mg total) by mouth 2 (two) times daily. For after 09/27/13    Dispense:  60 tablet    Refill:  0  . amphetamine-dextroamphetamine (ADDERALL) 30 MG tablet    Sig: Take 1 tablet (30 mg total) by mouth 2 (two) times daily.    Dispense:  60 tablet    Refill:  0  . amphetamine-dextroamphetamine (ADDERALL) 30 MG tablet    Sig: Take 1 tablet (30 mg total) by mouth 2 (two) times daily. For after 08/28/13    Dispense:  60 tablet    Refill:  0

## 2013-09-09 ENCOUNTER — Telehealth: Payer: Self-pay

## 2013-09-09 NOTE — Telephone Encounter (Signed)
Pt called and LM on VM checking on status of PA. I have not received a req from pharm, so called [facility] to find out what is needed. They reported that PA is needed on Adderall and they will re-fax the request. I called pt and let her know this info and the process of getting approval. I advised that I will let her and pharm know when I get decision from ins.

## 2013-09-12 NOTE — Telephone Encounter (Signed)
Received fax and completed PA on covermymeds. Awaiting decision.

## 2013-09-13 NOTE — Telephone Encounter (Signed)
PA approved through 09/11/14. Notified pt and pharm.

## 2013-11-23 ENCOUNTER — Encounter: Payer: Self-pay | Admitting: Internal Medicine

## 2013-11-23 ENCOUNTER — Telehealth: Payer: Self-pay

## 2013-11-23 ENCOUNTER — Ambulatory Visit (INDEPENDENT_AMBULATORY_CARE_PROVIDER_SITE_OTHER): Payer: 59 | Admitting: Internal Medicine

## 2013-11-23 VITALS — BP 128/76 | HR 101 | Temp 98.5°F | Resp 16 | Ht 69.0 in | Wt 133.2 lb

## 2013-11-23 DIAGNOSIS — F17200 Nicotine dependence, unspecified, uncomplicated: Secondary | ICD-10-CM

## 2013-11-23 DIAGNOSIS — F988 Other specified behavioral and emotional disorders with onset usually occurring in childhood and adolescence: Secondary | ICD-10-CM

## 2013-11-23 DIAGNOSIS — Z23 Encounter for immunization: Secondary | ICD-10-CM

## 2013-11-23 MED ORDER — AMPHETAMINE-DEXTROAMPHETAMINE 30 MG PO TABS: 30.0000 mg | ORAL_TABLET | Freq: Two times a day (BID) | ORAL | Status: DC

## 2013-11-23 NOTE — Telephone Encounter (Signed)
Spoke w/pt about need for f/up. Transferred her to appt center to set up appt. Pt was able to get appt for today d/t cancellation, and will get RFs then.

## 2013-11-23 NOTE — Progress Notes (Addendum)
Subjective:    Patient ID: Emily Mccarthy, female    DOB: 26-Aug-1981, 33 y.o.   MRN: [id number] This chart was scribed for Tonye Pearson, MD by Danella Maiers, ED Scribe. This patient was seen in room 24 and the patient's care was started at 11:39 AM.  Chief Complaint  Patient presents with  . Medication Refill    HPI HPI Comments: Emily Mccarthy is a 33 y.o. female with a h/o ADD who presents to the [facility] for a medication refill on Adderall. She states her current dose is working well. She is still smoking 2 cigarettes per day. Her OBGYN changed her from Chantix to Wellbutrin. She set her quit date for 2 weeks from now and this is when she plans to start the Wellbutrin.  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 She is excited to be going to [place] next week and [place] in June for 10 days, both for work.  PCP - No primary provider on file.GYN mainly  Patient Active Problem List   Diagnosis Date Noted  . 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 Nicotine addiction 06/13/2012  . Left elbow pain 06/24/2011  . Right wrist injury 06/17/2011   Current Outpatient Prescriptions on File Prior to Visit  Medication Sig Dispense Refill  . amphetamine-dextroamphetamine (ADDERALL) 30 MG tablet Take 1 tablet (30 mg total) by mouth 2 (two) times daily. For after 09/27/13  60 tablet  0  . amphetamine-dextroamphetamine (ADDERALL) 30 MG tablet Take 1 tablet (30 mg total) by mouth 2 (two) times daily.  60 tablet  0  . amphetamine-dextroamphetamine (ADDERALL) 30 MG tablet Take 1 tablet (30 mg total) by mouth 2 (two) times daily. For after 08/28/13  60 tablet  0  . NUVARING 0.12-0.015 MG/24HR vaginal ring        No current facility-administered medications on file prior to visit.      Review of Systems noncontrib    Objective:   Physical  Exam  Nursing note and vitals reviewed. Constitutional: She is oriented to person, place, and time. She appears well-developed and well-nourished. No distress.  HENT:  Head: Normocephalic and atraumatic.  Eyes: EOM are normal.  Neck: Neck supple. No tracheal deviation present.  Cardiovascular: Normal rate.   Pulmonary/Chest: Effort normal. No respiratory distress.  Musculoskeletal: Normal range of motion.  Neurological: She is alert and oriented to person, place, and time.  Skin: Skin is warm and dry.  Psychiatric: She has a normal mood and affect. Her behavior is normal.    Filed Vitals:   11/23/13 1135  BP: 128/76  Pulse: 101  Temp: 98.5 F (36.9 C)  TempSrc: Oral  Resp: 16  Height:  (1.753 m)  Weight: 133 lb 3.2 oz (60.419 kg)  SpO2: 100%   Wt Readings from Last 3 Encounters:  11/23/13 133 lb 3.2 oz (60.419 kg)  04/24/13 133 lb (60.328 kg)  04/22/13 134 lb (60.782 kg)         Assessment & Plan:    I have completed the patient encounter in its entirety as documented by the scribe, with editing by me where necessary. Ana Woodroof P. Merla Riches, M.D. ADD (attention deficit disorder) - Plan: amphetamine-dextroamphetamine (ADDERALL) 30 MG tablet, amphetamine-dextroamphetamine (ADDERALL) 30 MG tablet, amphetamine-dextroamphetamine (ADDERALL) 30 MG tablet  Need for prophylactic vaccination and inoculation against influenza -  Plan: Flu Vaccine QUAD 36+ mos IM  Attention deficit disorder  Nicotine addiction  Meds ordered this encounter  Medications  . amphetamine-dextroamphetamine (ADDERALL) 30 MG tablet    Sig: Take 1 tablet (30 mg total) by mouth 2 (two) times daily.    Dispense:  60 tablet    Refill:  0  . amphetamine-dextroamphetamine (ADDERALL) 30 MG tablet    Sig: Take 1 tablet (30 mg total) by mouth 2 (two) times daily. For 30 d after signing date    Dispense:  60 tablet    Refill:  0  . amphetamine-dextroamphetamine (ADDERALL) 30 MG tablet    Sig: Take 1  tablet (30 mg total) by mouth 2 (two) times daily. For 60d after signing date    Dispense:  60 tablet    Refill:  0   Call3/f-u 21mo

## 2013-11-23 NOTE — Telephone Encounter (Signed)
Pt needs a refill of adderall until she can come in to follow-up with dr. Merla Richesoolittle [phone]

## 2014-02-21 ENCOUNTER — Telehealth: Payer: Self-pay

## 2014-02-21 DIAGNOSIS — F988 Other specified behavioral and emotional disorders with onset usually occurring in childhood and adolescence: Secondary | ICD-10-CM

## 2014-02-21 NOTE — Telephone Encounter (Signed)
Refill  amphetamine-dextroamphetamine (ADDERALL) 30 MG tablet   [phone]

## 2014-02-23 MED ORDER — AMPHETAMINE-DEXTROAMPHETAMINE 30 MG PO TABS: 30.0000 mg | ORAL_TABLET | Freq: Two times a day (BID) | ORAL | Status: DC

## 2014-02-23 NOTE — Telephone Encounter (Signed)
Pt notified that it is up front for p/u 

## 2014-02-23 NOTE — Telephone Encounter (Signed)
Dr Merla Riches printed Rxs but was unable to sign so I reordered the Rx.

## 2014-06-21 ENCOUNTER — Telehealth: Payer: Self-pay

## 2014-06-21 DIAGNOSIS — F988 Other specified behavioral and emotional disorders with onset usually occurring in childhood and adolescence: Secondary | ICD-10-CM

## 2014-06-21 NOTE — Telephone Encounter (Signed)
Patient's needs a refill on her adderall until her appointment with Dr Merla Riches on 10/12.   Best#: [phone]

## 2014-06-22 MED ORDER — AMPHETAMINE-DEXTROAMPHETAMINE 30 MG PO TABS: 30.0000 mg | ORAL_TABLET | Freq: Two times a day (BID) | ORAL | Status: DC

## 2014-06-22 NOTE — Telephone Encounter (Signed)
Lmom saying rx is ready for pick up.

## 2014-07-19 ENCOUNTER — Encounter: Payer: Self-pay | Admitting: Internal Medicine

## 2014-07-19 ENCOUNTER — Ambulatory Visit (INDEPENDENT_AMBULATORY_CARE_PROVIDER_SITE_OTHER): Payer: Self-pay | Admitting: Internal Medicine

## 2014-07-19 VITALS — BP 120/70 | HR 109 | Temp 98.5°F | Resp 16 | Ht 69.5 in | Wt 147.0 lb

## 2014-07-19 DIAGNOSIS — F988 Other specified behavioral and emotional disorders with onset usually occurring in childhood and adolescence: Secondary | ICD-10-CM

## 2014-07-19 DIAGNOSIS — F909 Attention-deficit hyperactivity disorder, unspecified type: Secondary | ICD-10-CM

## 2014-07-19 MED ORDER — AMPHETAMINE-DEXTROAMPHETAMINE 30 MG PO TABS: 30.0000 mg | ORAL_TABLET | Freq: Two times a day (BID) | ORAL | Status: DC

## 2014-07-19 NOTE — Progress Notes (Addendum)
   Subjective:    Patient ID: Emily Mccarthy, female    DOB: 12/10/1980, 33 y.o.   MRN: [id number]  HPI  This is a 33 year old female with PMH ADD and tobacco abuse who is here for follow up.  She is currently unemployed, she got laid off. 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  She has not quit smoking yet. She is on the wellbutrin with cessation as well as anxiety and worry.  The adderall is working well. She is not having any problems.  Review of Systems Patient Active Problem List   Diagnosis Date Noted  . 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 Nicotine addiction 06/13/2012  . Left elbow pain 06/24/2011  . Right wrist injury 06/17/2011   Prior to Admission medications   Medication Sig Start Date End Date Taking? Authorizing Provider  amphetamine-dextroamphetamine (ADDERALL) 30 MG tablet Take 1 tablet by mouth 2 (two) times daily. For 60d after signing date 07/19/14  Yes Tonye Pearsonobert P Doolittle, MD  amphetamine-dextroamphetamine (ADDERALL) 30 MG tablet Take 1 tablet by mouth 2 (two) times daily. For 30 d after signing date 07/19/14  Yes Tonye Pearsonobert P Doolittle, MD  amphetamine-dextroamphetamine (ADDERALL) 30 MG tablet Take 1 tablet by mouth 2 (two) times daily. 07/19/14  Yes Tonye Pearsonobert P Doolittle, MD  NUVARING 0.12-0.015 MG/24HR vaginal ring  07/14/11  Yes Historical Provider, MD        Objective:   Physical Exam Physical Exam  Nursing note and vitals reviewed. Constitutional: She is oriented to person, place, and time. She appears well-developed and well-nourished. No distress.  HENT:  Head: Normocephalic and atraumatic.  Eyes: Pupils are equal, round, and reactive to light.  Neck: Normal range of motion.  Cardiovascular: Normal rate and regular rhythm.   Pulmonary/Chest: Effort normal. No respiratory distress.  Musculoskeletal: Normal range of motion.  Neurological: She is alert and oriented to person, place, and time.  Skin: Skin is warm and dry.  Psychiatric: She has a  normal mood and affect. Her behavior is normal.  BP 120/70  Pulse 109  Temp(Src) 98.5 F (36.9 C) (Oral)  Resp 16  Ht 5' 9.5" (1.765 m)  Wt 147 lb (66.679 kg)  BMI 21.40 kg/m2  SpO2 99%  LMP 06/28/2014     Assessment & Plan:  ADD (attention deficit disorder) - Plan: amphetamine-dextroamphetamine (ADDERALL) 30 MG tablet, amphetamine-dextroamphetamine (ADDERALL) 30 MG tablet, amphetamine-dextroamphetamine (ADDERALL) 30 MG tablet  Attention deficit disorder  Meds ordered this encounter  Medications  . amphetamine-dextroamphetamine (ADDERALL) 30 MG tablet    Sig: Take 1 tablet by mouth 2 (two) times daily. For 60d after signing date    Dispense:  60 tablet    Refill:  0  . amphetamine-dextroamphetamine (ADDERALL) 30 MG tablet    Sig: Take 1 tablet by mouth 2 (two) times daily. For 30 d after signing date    Dispense:  60 tablet    Refill:  0  . amphetamine-dextroamphetamine (ADDERALL) 30 MG tablet    Sig: Take 1 tablet by mouth 2 (two) times daily.    Dispense:  60 tablet    Refill:  0   Call 3/f-u 6  I have participated in the care of this patient with the Advanced Practice Provider and agree with Diagnosis and Plan as documented. Robert P. Merla Richesoolittle, M.D.

## 2014-09-15 ENCOUNTER — Emergency Department (HOSPITAL_BASED_OUTPATIENT_CLINIC_OR_DEPARTMENT_OTHER)
Admission: EM | Admit: 2014-09-15 | Discharge: 2014-09-15 | Disposition: A | Discharge status: Home / Self Care | Payer: BC Managed Care – PPO | Attending: Emergency Medicine | Admitting: Emergency Medicine

## 2014-09-15 ENCOUNTER — Encounter (HOSPITAL_BASED_OUTPATIENT_CLINIC_OR_DEPARTMENT_OTHER): Payer: Self-pay | Admitting: Emergency Medicine

## 2014-09-15 DIAGNOSIS — Z720 Tobacco use: Secondary | ICD-10-CM | POA: Insufficient documentation

## 2014-09-15 DIAGNOSIS — Z79899 Other long term (current) drug therapy: Secondary | ICD-10-CM | POA: Insufficient documentation

## 2014-09-15 DIAGNOSIS — J020 Streptococcal pharyngitis: Secondary | ICD-10-CM | POA: Insufficient documentation

## 2014-09-15 LAB — RAPID STREP SCREEN (MED CTR MEBANE ONLY): Streptococcus, Group A Screen (Direct): NEGATIVE

## 2014-09-15 MED ORDER — PREDNISONE 10 MG PO TABS: 20.0000 mg | ORAL_TABLET | Freq: Two times a day (BID) | ORAL | Status: DC

## 2014-09-15 MED ORDER — CEPHALEXIN 500 MG PO CAPS: 500.0000 mg | ORAL_CAPSULE | Freq: Four times a day (QID) | ORAL | Status: DC

## 2014-09-15 NOTE — ED Notes (Signed)
Pt reports sore throat x 2 days, awoke this am with sob, normal breathing in triage,

## 2014-09-15 NOTE — ED Provider Notes (Signed)
CSN: [id number]     Arrival date & time 09/15/14  [id number] History   First MD Initiated Contact with Patient 09/15/14 [phone]     Chief Complaint  Patient presents with  . Sore Throat     (Consider location/radiation/quality/duration/timing/severity/associated sxs/prior Treatment) HPI Comments: Patient is a 33 year old female who presents with a 2 day history of sore throat. She states she has significant discomfort with swallowing and is difficult to eat and drink. She denies fevers. She was seen yesterday at an outpatient clinic and rapid strep test was negative. She presents this morning with worsening pain.  Patient is a 33 y.o. female presenting with pharyngitis. The history is provided by the patient.  Sore Throat This is a new problem. The current episode started 2 days ago. The problem occurs constantly. The problem has been gradually worsening. Pertinent negatives include no shortness of breath. The symptoms are aggravated by swallowing. Nothing relieves the symptoms. She has tried nothing for the symptoms. The treatment provided no relief.    History reviewed. No pertinent past medical history. History reviewed. No pertinent past surgical history. Family History  Problem Relation Age of Onset  . Heart attack Neg Hx   . Sudden death Neg Hx    History  Substance Use Topics  . Smoking status: Current Every Day Smoker -- 0.50 packs/day  . Smokeless tobacco: Not on file  . Alcohol Use: Yes   OB History    No data available     Review of Systems  Respiratory: Negative for shortness of breath.   All other systems reviewed and are negative.     Allergies  Review of patient's allergies indicates no known allergies.  Home Medications   Prior to Admission medications   Medication Sig Start Date End Date Taking? Authorizing Provider  amphetamine-dextroamphetamine (ADDERALL) 30 MG tablet Take 1 tablet by mouth 2 (two) times daily. For 60d after signing date 07/19/14   Tonye Pearsonobert P  Doolittle, MD  amphetamine-dextroamphetamine (ADDERALL) 30 MG tablet Take 1 tablet by mouth 2 (two) times daily. For 30 d after signing date 07/19/14   Tonye Pearsonobert P Doolittle, MD  amphetamine-dextroamphetamine (ADDERALL) 30 MG tablet Take 1 tablet by mouth 2 (two) times daily. 07/19/14   Tonye Pearsonobert P Doolittle, MD  NUVARING 0.12-0.015 MG/24HR vaginal ring  07/14/11   Historical Provider, MD   BP 130/73 mmHg  Pulse 109  Temp(Src) 99.1 F (37.3 C) (Oral)  Resp 18  Ht 5\' 10"  (1.778 m)  Wt 150 lb (68.04 kg)  BMI 21.52 kg/m2  SpO2 100% Physical Exam  Constitutional: She is oriented to person, place, and time. She appears well-developed and well-nourished. No distress.  HENT:  Head: Normocephalic and atraumatic.  The oropharynx is erythematous without exudates.  TMs are clear bilaterally.  Neck: Normal range of motion. Neck supple.  Cardiovascular: Normal rate and regular rhythm.  Exam reveals no gallop and no friction rub.   No murmur heard. Pulmonary/Chest: Effort normal and breath sounds normal. No respiratory distress. She has no wheezes.  Abdominal: Soft. Bowel sounds are normal. She exhibits no distension. There is no tenderness.  Musculoskeletal: Normal range of motion.  Lymphadenopathy:    She has cervical adenopathy.  Neurological: She is alert and oriented to person, place, and time.  Skin: Skin is warm and dry. She is not diaphoretic.  Nursing note and vitals reviewed.   ED Course  Procedures (including critical care time) Labs Review Labs Reviewed  RAPID STREP SCREEN    Imaging Review  No results found.   EKG Interpretation None      MDM   Final diagnoses:  None    Strep test is negative. I suspect a viral etiology my plan will be to prescribe prednisone for the redness and inflammation of her throat. I will also give her a prescription for Keflex which she will hold for 2 days before filling. If she is not feeling better or worsens she will fill this  prescription.    Geoffery Lyonsouglas Letina Luckett, MD 09/15/14 [phone]

## 2014-09-15 NOTE — Discharge Instructions (Signed)
Prednisone as prescribed.  If you're not improving in the next 2 days, fill the prescription for Keflex you been provided.  If your strep culture returns positive and requires additional treatment, we will call you and let you know.   Pharyngitis Pharyngitis is redness, pain, and swelling (inflammation) of your pharynx.  CAUSES  Pharyngitis is usually caused by infection. Most of the time, these infections are from viruses (viral) and are part of a cold. However, sometimes pharyngitis is caused by bacteria (bacterial). Pharyngitis can also be caused by allergies. Viral pharyngitis may be spread from person to person by coughing, sneezing, and personal items or utensils (cups, forks, spoons, toothbrushes). Bacterial pharyngitis may be spread from person to person by more intimate contact, such as kissing.  SIGNS AND SYMPTOMS  Symptoms of pharyngitis include:   Sore throat.   Tiredness (fatigue).   Low-grade fever.   Headache.  Joint pain and muscle aches.  Skin rashes.  Swollen lymph nodes.  Plaque-like film on throat or tonsils (often seen with bacterial pharyngitis). DIAGNOSIS  Your health care provider will ask you questions about your illness and your symptoms. Your medical history, along with a physical exam, is often all that is needed to diagnose pharyngitis. Sometimes, a rapid strep test is done. Other lab tests may also be done, depending on the suspected cause.  TREATMENT  Viral pharyngitis will usually get better in 3-4 days without the use of medicine. Bacterial pharyngitis is treated with medicines that kill germs (antibiotics).  HOME CARE INSTRUCTIONS   Drink enough water and fluids to keep your urine clear or pale yellow.   Only take over-the-counter or prescription medicines as directed by your health care provider:   If you are prescribed antibiotics, make sure you finish them even if you start to feel better.   Do not take aspirin.   Get lots of  rest.   Gargle with 8 oz of salt water ( tsp of salt per 1 qt of water) as often as every 1-2 hours to soothe your throat.   Throat lozenges (if you are not at risk for choking) or sprays may be used to soothe your throat. SEEK MEDICAL CARE IF:   You have large, tender lumps in your neck.  You have a rash.  You cough up green, yellow-brown, or bloody spit. SEEK IMMEDIATE MEDICAL CARE IF:   Your neck becomes stiff.  You drool or are unable to swallow liquids.  You vomit or are unable to keep medicines or liquids down.  You have severe pain that does not go away with the use of recommended medicines.  You have trouble breathing (not caused by a stuffy nose). MAKE SURE YOU:   Understand these instructions.  Will watch your condition.  Will get help right away if you are not doing well or get worse. Document Released: 09/15/2005 Document Revised: 07/06/2013 Document Reviewed: 05/23/2013 [facility] Patient Information 2015 [employer] [place]. This information is not intended to replace advice given to you by your health care provider. Make sure you discuss any questions you have with your health care provider.

## 2014-09-15 NOTE — ED Notes (Signed)
MD at bedside. 

## 2014-09-15 NOTE — ED Notes (Signed)
C/o sorethroat w diff swallowing, body aches, rt facial pain and ha x 3 days

## 2014-09-17 LAB — CULTURE, GROUP A STREP

## 2014-10-24 ENCOUNTER — Telehealth: Payer: Self-pay

## 2014-10-24 DIAGNOSIS — F988 Other specified behavioral and emotional disorders with onset usually occurring in childhood and adolescence: Secondary | ICD-10-CM

## 2014-10-24 MED ORDER — AMPHETAMINE-DEXTROAMPHETAMINE 30 MG PO TABS: 30.0000 mg | ORAL_TABLET | Freq: Two times a day (BID) | ORAL | Status: DC

## 2014-10-24 NOTE — Telephone Encounter (Signed)
Pt notified. Rx in pick up drawer. 

## 2014-10-24 NOTE — Telephone Encounter (Signed)
Pt in need of her 30mg s of ADDERALL. Please call [phone] when ready

## 2014-10-24 NOTE — Telephone Encounter (Signed)
Meds ordered this encounter  Medications  . amphetamine-dextroamphetamine (ADDERALL) 30 MG tablet    Sig: Take 1 tablet by mouth 2 (two) times daily.    Dispense:  60 tablet    Refill:  0  . amphetamine-dextroamphetamine (ADDERALL) 30 MG tablet    Sig: Take 1 tablet by mouth 2 (two) times daily. For 30 d after signing date    Dispense:  60 tablet    Refill:  0  . amphetamine-dextroamphetamine (ADDERALL) 30 MG tablet    Sig: Take 1 tablet by mouth 2 (two) times daily. For 60d after signing date    Dispense:  60 tablet    Refill:  0

## 2014-12-25 ENCOUNTER — Telehealth: Payer: Self-pay

## 2014-12-25 DIAGNOSIS — F988 Other specified behavioral and emotional disorders with onset usually occurring in childhood and adolescence: Secondary | ICD-10-CM

## 2014-12-25 NOTE — Telephone Encounter (Signed)
PATIENT STATES IT IS TIME FOR DR. Merla RichesOLITTLE TO WRITE HER PRESCRIPTION FOR ADDERALL 30 MG. PLEASE CALL HER WHEN IT IS READY TO BE PICKED UP. BEST PHONE [phone] (CELL)  MBC

## 2014-12-27 MED ORDER — AMPHETAMINE-DEXTROAMPHETAMINE 30 MG PO TABS: 30.0000 mg | ORAL_TABLET | Freq: Two times a day (BID) | ORAL | Status: DC

## 2014-12-27 NOTE — Telephone Encounter (Signed)
Time for f/u--- Meds ordered this encounter  Medications  . amphetamine-dextroamphetamine (ADDERALL) 30 MG tablet    Sig: Take 1 tablet by mouth 2 (two) times daily.    Dispense:  60 tablet    Refill:  0

## 2014-12-28 NOTE — Telephone Encounter (Signed)
Rx in pick up draw. Pt notified. 

## 2015-02-17 ENCOUNTER — Telehealth: Payer: Self-pay

## 2015-02-17 DIAGNOSIS — F988 Other specified behavioral and emotional disorders with onset usually occurring in childhood and adolescence: Secondary | ICD-10-CM

## 2015-02-17 NOTE — Telephone Encounter (Signed)
Needing a refill on adderall

## 2015-02-19 MED ORDER — AMPHETAMINE-DEXTROAMPHETAMINE 30 MG PO TABS: 30.0000 mg | ORAL_TABLET | Freq: Two times a day (BID) | ORAL | Status: DC

## 2015-02-19 NOTE — Telephone Encounter (Signed)
Pt notified rx is ready. 

## 2015-02-19 NOTE — Telephone Encounter (Signed)
Spoke with pt. Let her know she is due for f/u. She just started a new job and won't have insurance for another month. Wants to know if we can write one more 30 day supply

## 2015-02-19 NOTE — Telephone Encounter (Signed)
Last refill without an OV.

## 2015-03-28 ENCOUNTER — Ambulatory Visit (INDEPENDENT_AMBULATORY_CARE_PROVIDER_SITE_OTHER): Payer: BLUE CROSS/BLUE SHIELD | Admitting: Internal Medicine

## 2015-03-28 ENCOUNTER — Encounter: Payer: Self-pay | Admitting: Internal Medicine

## 2015-03-28 VITALS — BP 116/76 | HR 79 | Temp 98.0°F | Resp 16 | Ht 69.5 in | Wt 160.0 lb

## 2015-03-28 DIAGNOSIS — F909 Attention-deficit hyperactivity disorder, unspecified type: Secondary | ICD-10-CM

## 2015-03-28 DIAGNOSIS — F988 Other specified behavioral and emotional disorders with onset usually occurring in childhood and adolescence: Secondary | ICD-10-CM

## 2015-03-28 MED ORDER — AMPHETAMINE-DEXTROAMPHETAMINE 30 MG PO TABS: 30.0000 mg | ORAL_TABLET | Freq: Two times a day (BID) | ORAL | Status: DC

## 2015-03-28 NOTE — Progress Notes (Signed)
   Subjective:    Patient ID: Emily FractionHeidi J Mccarthy, female    DOB: 03/08/1981, 34 y.o.   MRN: [id number]  HPI This is a very pleasant 34 yo female who presents today for refill of her Adderall.She recently got a new job at [employer]. She is very happy with her job. She takes adderall 30 mg twice a day, not always both on the weekends. She has good focus and concentration on current dose. She sleeps well, about 6 hours a night.   She is using Nuva ring for contraception.   No past medical history on file. No past surgical history on file. Family History  Problem Relation Age of Onset  . Heart attack Neg Hx   . Sudden death Neg Hx    History  Substance Use Topics  . Smoking status: Current Every Day Smoker -- 0.50 packs/day  . Smokeless tobacco: Not on file  . Alcohol Use: Yes  Medications, allergies, past medical history, surgical history, family history, social history and problem list reviewed and updated.  Review of Systems No chest pain, no SOB, no palpitations, no headache.    Objective:   Physical Exam Physical Exam  Constitutional: Oriented to person, place, and time.  Well developed and well-nourished.  HENT:  Head: Normocephalic and atraumatic.  Eyes: Conjunctivae are normal.  Neck: Normal range of motion. Neck supple.  Cardiovascular: Normal rate, regular rhythm and normal heart sounds.   Pulmonary/Chest: Effort normal and breath sounds normal.  Musculoskeletal: Normal range of motion.  Neurological: Alert and oriented to person, place, and time.  Skin: Skin is warm and dry.  Psychiatric: Normal mood and affect. Behavior is normal. Judgment and thought content normal.  Vitals reviewed. BP 116/76 mmHg  Pulse 79  Temp(Src) 98 F (36.7 C)  Resp 16  Ht 5' 9.5" (1.765 m)  Wt 160 lb (72.576 kg)  BMI 23.30 kg/m2  SpO2 97% Wt Readings from Last 3 Encounters:  03/28/15 160 lb (72.576 kg)  09/15/14 150 lb (68.04 kg)  07/19/14 147 lb (66.679 kg)      Assessment & Plan:    1. ADD (attention deficit disorder) - amphetamine-dextroamphetamine (ADDERALL) 30 MG tablet; Take 1 tablet by mouth 2 (two) times daily. For 60d after signing date  Dispense: 60 tablet; Refill: 0 - amphetamine-dextroamphetamine (ADDERALL) 30 MG tablet; Take 1 tablet by mouth 2 (two) times daily.  Dispense: 60 tablet; Refill: 0 - amphetamine-dextroamphetamine (ADDERALL) 30 MG tablet; Take 1 tablet by mouth 2 (two) times daily.  Dispense: 60 tablet; Refill: 0 - she can call after 3 months and will refill for 3 additional months - follow up in 6 months  Olean Reeeborah Icelynn Onken, FNP-BC  [facility], [facility]  03/28/2015 2:17 PM I have participated in the care of this patient with the Advanced Practice Provider and agree with Diagnosis and Plan as documented. Robert P. Merla Richesoolittle, M.D.

## 2015-07-02 ENCOUNTER — Telehealth: Payer: Self-pay

## 2015-07-02 DIAGNOSIS — F988 Other specified behavioral and emotional disorders with onset usually occurring in childhood and adolescence: Secondary | ICD-10-CM

## 2015-07-02 MED ORDER — AMPHETAMINE-DEXTROAMPHETAMINE 30 MG PO TABS: 30.0000 mg | ORAL_TABLET | Freq: Two times a day (BID) | ORAL | Status: DC

## 2015-07-02 NOTE — Telephone Encounter (Signed)
Meds ordered this encounter  Medications  . amphetamine-dextroamphetamine (ADDERALL) 30 MG tablet    Sig: Take 1 tablet by mouth 2 (two) times daily. For 60d after signing date    Dispense:  60 tablet    Refill:  0  . amphetamine-dextroamphetamine (ADDERALL) 30 MG tablet    Sig: Take 1 tablet by mouth 2 (two) times daily.    Dispense:  60 tablet    Refill:  0    May fill 30 days after date of prescription  . amphetamine-dextroamphetamine (ADDERALL) 30 MG tablet    Sig: Take 1 tablet by mouth 2 (two) times daily.    Dispense:  60 tablet    Refill:  0

## 2015-07-02 NOTE — Telephone Encounter (Signed)
Pt is needing a refill on adderall °

## 2015-07-03 NOTE — Telephone Encounter (Signed)
Pt.notified

## 2015-10-12 ENCOUNTER — Telehealth: Payer: Self-pay

## 2015-10-12 DIAGNOSIS — F988 Other specified behavioral and emotional disorders with onset usually occurring in childhood and adolescence: Secondary | ICD-10-CM

## 2015-10-12 MED ORDER — AMPHETAMINE-DEXTROAMPHETAMINE 30 MG PO TABS: 30.0000 mg | ORAL_TABLET | Freq: Two times a day (BID) | ORAL | Status: DC

## 2015-10-12 NOTE — Telephone Encounter (Signed)
Pt would like a refill on her amphetamine-dextroamphetamine (ADDERALL) 30 MG tablet [[id number]]. CB # [phone]

## 2015-10-12 NOTE — Telephone Encounter (Signed)
I Meds ordered this encounter  Medications  . amphetamine-dextroamphetamine (ADDERALL) 30 MG tablet    Sig: Take 1 tablet by mouth 2 (two) times daily. For 60d after signing date    Dispense:  60 tablet    Refill:  0  . amphetamine-dextroamphetamine (ADDERALL) 30 MG tablet    Sig: Take 1 tablet by mouth 2 (two) times daily.    Dispense:  60 tablet    Refill:  0    May fill 30 days after date of prescription  . amphetamine-dextroamphetamine (ADDERALL) 30 MG tablet    Sig: Take 1 tablet by mouth 2 (two) times daily.    Dispense:  60 tablet    Refill:  0

## 2015-10-15 NOTE — Telephone Encounter (Signed)
Notified pt ready on VM. 

## 2015-12-03 ENCOUNTER — Telehealth: Payer: Self-pay

## 2015-12-03 NOTE — Telephone Encounter (Signed)
PA approved for Adderall 30 mg BID through 12/02/16. Case # [id number]. Notified pharm.

## 2016-01-22 ENCOUNTER — Other Ambulatory Visit: Payer: Self-pay

## 2016-01-22 DIAGNOSIS — F988 Other specified behavioral and emotional disorders with onset usually occurring in childhood and adolescence: Secondary | ICD-10-CM

## 2016-01-22 NOTE — Telephone Encounter (Signed)
Pt is needing a refill on adderall   Best number [phone]

## 2016-01-22 NOTE — Telephone Encounter (Signed)
Needs OV.  

## 2016-01-24 NOTE — Telephone Encounter (Signed)
Pt. Is requesting an appt. She needs an appt with Debbie since Dr. Merla Richesoolittle is no longer accepting appt.s and Eunice BlaseDebbie has written her prescription before

## 2016-01-24 NOTE — Telephone Encounter (Signed)
She needs an OV and since it looks like she has an appt with Debbie on May 9th then that is when she can get her refills, I spoke to this pt before she did not want to come in to see Dr. Yong Channelolittle in the walk in and Eunice BlaseDebbie has filled her rx before

## 2016-01-24 NOTE — Telephone Encounter (Signed)
[phone]  REQUESTING REFILL   amphetamine-dextroamphetamine (ADDERALL) 30 MG tablet  Scheduled appt with Eunice Blaseebbie on May 9th

## 2016-01-25 NOTE — Telephone Encounter (Signed)
She must be seen a minimum of every 6 months to get this medication. She has not been seen since 6/16. I will not fill prior to her appointment with me on May 9. She is of course welcome to come to walk in for either me or Dr. Merla Richesoolittle prior to May 9.

## 2016-01-28 NOTE — Telephone Encounter (Signed)
Called and advised pt of need to be seen for Rx. She will try to come in first thing in morn Wed or Thurs this week at walk in.

## 2016-02-05 ENCOUNTER — Encounter: Payer: Self-pay | Admitting: Family Medicine

## 2016-02-05 ENCOUNTER — Ambulatory Visit (INDEPENDENT_AMBULATORY_CARE_PROVIDER_SITE_OTHER): Payer: 59 | Admitting: Family Medicine

## 2016-02-05 VITALS — BP 111/71 | HR 80 | Temp 98.2°F | Resp 16 | Ht 70.0 in | Wt 161.0 lb

## 2016-02-05 DIAGNOSIS — F909 Attention-deficit hyperactivity disorder, unspecified type: Secondary | ICD-10-CM

## 2016-02-05 DIAGNOSIS — F988 Other specified behavioral and emotional disorders with onset usually occurring in childhood and adolescence: Secondary | ICD-10-CM

## 2016-02-05 MED ORDER — AMPHETAMINE-DEXTROAMPHETAMINE 30 MG PO TABS: 30.0000 mg | ORAL_TABLET | Freq: Two times a day (BID) | ORAL | Status: DC

## 2016-02-05 NOTE — Patient Instructions (Signed)
     IF you received an x-ray today, you will receive an invoice from [facility]. Please contact [facility] at [phone] with questions or concerns regarding your invoice.   IF you received labwork today, you will receive an invoice from [facility]. Please contact [facility] at [phone] with questions or concerns regarding your invoice.   Our billing staff will not be able to assist you with questions regarding bills from these companies.  You will be contacted with the lab results as soon as they are available. The fastest way to get your results is to activate your My Chart account. Instructions are located on the last page of this paperwork. If you have not heard from us regarding the results in 2 weeks, please contact this office.      

## 2016-02-05 NOTE — Progress Notes (Signed)
   Subjective:    Patient ID: Emily Mccarthy, female    DOB: 12/30/1980, 35 y.o.   MRN: [id number]  HPI This is a pleasant 35 yo female who presents today for follow up of ADD. She had to change jobs because the training phase was too prolonged. She went back to work at [employer]. She is happy with the change. Adderall 30 mg BID working well for her. No side effects. Doesn't take every day, will hold on weekends.   No past medical history on file. No past surgical history on file. Family History  Problem Relation Age of Onset  . Heart attack Neg Hx   . Sudden death Neg Hx    Social History  Substance Use Topics  . Smoking status: Current Every Day Smoker -- 0.50 packs/day  . Smokeless tobacco: None  . Alcohol Use: Yes    Review of Systems No chest pain, no SOB, no palpitations, no insomnia.     Objective:   Physical Exam Physical Exam  Constitutional: Oriented to person, place, and time. She appears well-developed and well-nourished.  HENT:  Head: Normocephalic and atraumatic.  Eyes: Conjunctivae are normal.  Neck: Normal range of motion. Neck supple.  Cardiovascular: Normal rate, regular rhythm and normal heart sounds.   Pulmonary/Chest: Effort normal and breath sounds normal.  Musculoskeletal: Normal range of motion.  Neurological: Alert and oriented to person, place, and time.  Skin: Skin is warm and dry.  Psychiatric: Normal mood and affect. Behavior is normal. Judgment and thought content normal.  Vitals reviewed.     BP 111/71 mmHg  Pulse 80  Temp(Src) 98.2 F (36.8 C)  Resp 16  Ht 5\' 10"  (1.778 m)  Wt 161 lb (73.029 kg)  BMI 23.10 kg/m2 Wt Readings from Last 3 Encounters:  02/05/16 161 lb (73.029 kg)  03/28/15 160 lb (72.576 kg)  09/15/14 150 lb (68.04 kg)       Assessment & Plan:  1. ADD (attention deficit disorder) - amphetamine-dextroamphetamine (ADDERALL) 30 MG tablet; Take 1 tablet by mouth 2 (two) times daily. For 60d after signing  date  Dispense: 60 tablet; Refill: 0 - amphetamine-dextroamphetamine (ADDERALL) 30 MG tablet; Take 1 tablet by mouth 2 (two) times daily.  Dispense: 60 tablet; Refill: 0 - amphetamine-dextroamphetamine (ADDERALL) 30 MG tablet; Take 1 tablet by mouth 2 (two) times daily.  Dispense: 60 tablet; Refill: 0 - she can contact me in 3 months for refill, must be seen in office every 6 months  Olean Reeeborah [name], FNP-BC  [facility], [facility]  02/06/2016 9:09 PM

## 2016-06-19 ENCOUNTER — Telehealth: Payer: Self-pay

## 2016-06-19 ENCOUNTER — Other Ambulatory Visit: Payer: Self-pay

## 2016-06-19 DIAGNOSIS — F988 Other specified behavioral and emotional disorders with onset usually occurring in childhood and adolescence: Secondary | ICD-10-CM

## 2016-06-19 MED ORDER — AMPHETAMINE-DEXTROAMPHETAMINE 30 MG PO TABS: 30.0000 mg | ORAL_TABLET | Freq: Two times a day (BID) | ORAL | 0 refills | Status: DC

## 2016-06-19 NOTE — Telephone Encounter (Signed)
Meds ordered this encounter  Medications  . amphetamine-dextroamphetamine (ADDERALL) 30 MG tablet    Sig: Take 1 tablet by mouth 2 (two) times daily. For 60d after signing date    Dispense:  60 tablet    Refill:  0  . amphetamine-dextroamphetamine (ADDERALL) 30 MG tablet    Sig: Take 1 tablet by mouth 2 (two) times daily.    Dispense:  60 tablet    Refill:  0    May fill 30 days after date of prescription  . amphetamine-dextroamphetamine (ADDERALL) 30 MG tablet    Sig: Take 1 tablet by mouth 2 (two) times daily.    Dispense:  60 tablet    Refill:  0   Please remind patient that she will need to be seen (and establish with a new provider) for the next set.

## 2016-06-19 NOTE — Telephone Encounter (Signed)
PATIENT STATES SHE NEEDS HER 3 MONTHS OF ADDERALL PLAIN. SHE THOUGHT SHE HAD ANOTHER PRESCRIPTION LEFT BUT THE PHARMACIST TOLD HER SHE DIDN'T. SHE WILL BE GOING OUT OF TOWN TOMORROW (FRIDAY). SO SHE WOULD LIKE TO GET IT AS SOON AS POSSIBLE. (I DID EXPLAIN OUR TURN-AROUND TIME ON PRESCRIPTION REFILLS TO HER.) BEST PHONE [phone] (CELL)  MBC

## 2016-06-19 NOTE — Telephone Encounter (Signed)
Emily Mccarthy and gessner pt last seen 02/05/2016 and given 3 months of refills. Pended 3 more for review.

## 2016-06-20 NOTE — Telephone Encounter (Signed)
Advised pt RFs ready and need to est care w/new provider. Pt agreed.

## 2016-06-20 NOTE — Telephone Encounter (Signed)
Notified pt ready. 

## 2016-09-09 ENCOUNTER — Ambulatory Visit (INDEPENDENT_AMBULATORY_CARE_PROVIDER_SITE_OTHER): Payer: 59 | Admitting: Physician Assistant

## 2016-09-09 ENCOUNTER — Encounter: Payer: Self-pay | Admitting: Physician Assistant

## 2016-09-09 DIAGNOSIS — F988 Other specified behavioral and emotional disorders with onset usually occurring in childhood and adolescence: Secondary | ICD-10-CM

## 2016-09-09 MED ORDER — AMPHETAMINE-DEXTROAMPHETAMINE 30 MG PO TABS: 30.0000 mg | ORAL_TABLET | Freq: Two times a day (BID) | ORAL | 0 refills | Status: DC

## 2016-09-09 NOTE — Progress Notes (Signed)
   Emily FractionHeidi J Mccarthy  MRN: [id number] DOB: 06/13/1981  Subjective:  Pt presents to clinic for medication refill.  She works for [employer].  She is having no problems or concerns regarding her medication.  Review of Systems  Constitutional: Negative for appetite change, chills, fever and unexpected weight change.  Cardiovascular: Negative for chest pain and palpitations.    Patient Active Problem List   Diagnosis Date Noted  . 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 Nicotine addiction 06/13/2012  . Left elbow pain 06/24/2011  . Right wrist injury 06/17/2011    Current Outpatient Prescriptions on File Prior to Visit  Medication Sig Dispense Refill  . NUVARING 0.12-0.015 MG/24HR vaginal ring     . clindamycin (CLEOCIN T) 1 % lotion 1 APPLICATION APPLY ON THE SKIN IN THE MORNING APPLY THIN LAYER TO FACE EVERY MORNING  12   No current facility-administered medications on file prior to visit.     No Known Allergies  Pt patients past, family and social history were reviewed and updated.   Objective:  BP 108/70 (BP Location: Left Arm, Patient Position: Sitting, Cuff Size: Normal)   Pulse 89   Temp 98.8 F (37.1 C)   Resp 16   Ht 5' 9.5" (1.765 m)   Wt 168 lb (76.2 kg)   SpO2 98%   BMI 24.45 kg/m   Physical Exam  Constitutional: She is oriented to person, place, and time and well-developed, well-nourished, and in no distress.  HENT:  Head: Normocephalic and atraumatic.  Right Ear: Hearing and external ear normal.  Left Ear: Hearing and external ear normal.  Eyes: Conjunctivae are normal.  Neck: Normal range of motion.  Pulmonary/Chest: Effort normal.  Neurological: She is alert and oriented to person, place, and time. Gait normal.  Skin: Skin is warm and dry.  Psychiatric: Mood, memory, affect and judgment normal.  Vitals reviewed.   Assessment and Plan :  Attention deficit disorder (ADD) without hyperactivity - Plan: amphetamine-dextroamphetamine  (ADDERALL) 30 MG tablet, amphetamine-dextroamphetamine (ADDERALL) 30 MG tablet, amphetamine-dextroamphetamine (ADDERALL) 30 MG tablet   Refilled medications - ok to fill in 3 months - next OV 6 months  Benny LennertSarah Ricke Kimoto PA-C  [facility] 09/09/2016 4:06 PM

## 2016-09-09 NOTE — Patient Instructions (Signed)
     IF you received an x-ray today, you will receive an invoice from [facility]. Please contact [facility] at [phone] with questions or concerns regarding your invoice.   IF you received labwork today, you will receive an invoice from [facility]. Please contact [facility] at [phone] with questions or concerns regarding your invoice.   Our billing staff will not be able to assist you with questions regarding bills from these companies.  You will be contacted with the lab results as soon as they are available. The fastest way to get your results is to activate your My Chart account. Instructions are located on the last page of this paperwork. If you have not heard from us regarding the results in 2 weeks, please contact this office.      

## 2016-11-18 ENCOUNTER — Telehealth: Payer: Self-pay

## 2016-11-18 DIAGNOSIS — F988 Other specified behavioral and emotional disorders with onset usually occurring in childhood and adolescence: Secondary | ICD-10-CM

## 2016-11-18 NOTE — Telephone Encounter (Signed)
PATIENT WOULD LIKE SARAH TO KNOW THAT IT IS TIME TO GET HER 3 MONTH REFILLS ON HER ADDERALL 30 MG. SHE SAID SHE GETS #60. PLEASE CALL HER WHEN THE PRESCRIPTIONS CAN BE PICKED UP. BEST PHONE [phone] (CELL) MBC

## 2016-11-25 MED ORDER — AMPHETAMINE-DEXTROAMPHETAMINE 30 MG PO TABS: 30.0000 mg | ORAL_TABLET | Freq: Two times a day (BID) | ORAL | 0 refills | Status: DC

## 2016-11-25 NOTE — Telephone Encounter (Signed)
Done.  Please have the patient schedule an appt for you before 3 months is over

## 2016-12-09 ENCOUNTER — Telehealth: Payer: Self-pay | Admitting: Physician Assistant

## 2016-12-09 NOTE — Telephone Encounter (Signed)
Please review

## 2016-12-09 NOTE — Telephone Encounter (Signed)
Pt is needing a refill on her adderall plain   Best number [phone]

## 2016-12-09 NOTE — Telephone Encounter (Signed)
Add med needs PA [id number] covermymeds

## 2016-12-10 NOTE — Telephone Encounter (Signed)
Pt advised.

## 2016-12-10 NOTE — Telephone Encounter (Signed)
Approvedtoday  Request Reference Number: [id number]. AMPHET/DEXTR TAB 30MG  is approved through 12/10/2017. For further questions, call [phone].

## 2017-03-05 ENCOUNTER — Encounter: Payer: Self-pay | Admitting: Emergency Medicine

## 2017-03-05 ENCOUNTER — Ambulatory Visit (INDEPENDENT_AMBULATORY_CARE_PROVIDER_SITE_OTHER): Payer: 59 | Admitting: Emergency Medicine

## 2017-03-05 VITALS — BP 102/64 | HR 81 | Temp 99.0°F | Resp 16 | Ht 69.0 in | Wt 165.2 lb

## 2017-03-05 DIAGNOSIS — M62838 Other muscle spasm: Secondary | ICD-10-CM | POA: Diagnosis not present

## 2017-03-05 DIAGNOSIS — M542 Cervicalgia: Secondary | ICD-10-CM | POA: Insufficient documentation

## 2017-03-05 HISTORY — DX: Other muscle spasm: M62.838

## 2017-03-05 MED ORDER — CYCLOBENZAPRINE HCL 10 MG PO TABS: 10.0000 mg | ORAL_TABLET | Freq: Every day | ORAL | 0 refills | Status: DC

## 2017-03-05 MED ORDER — DICLOFENAC SODIUM 75 MG PO TBEC: 75.0000 mg | DELAYED_RELEASE_TABLET | Freq: Two times a day (BID) | ORAL | 0 refills | Status: AC

## 2017-03-05 NOTE — Patient Instructions (Addendum)
   IF you received an x-ray today, you will receive an invoice from [facility]. Please contact [facility] at [phone] with questions or concerns regarding your invoice.   IF you received labwork today, you will receive an invoice from [facility]. Please contact [facility] at [phone] with questions or concerns regarding your invoice.   Our billing staff will not be able to assist you with questions regarding bills from these companies.  You will be contacted with the lab results as soon as they are available. The fastest way to get your results is to activate your My Chart account. Instructions are located on the last page of this paperwork. If you have not heard from us regarding the results in 2 weeks, please contact this office.         Acute Torticollis, Adult Torticollis is a condition in which the muscles of the neck tighten (contract) abnormally, causing the neck to twist and the head to move into an unnatural position. Torticollis that develops suddenly is called acute torticollis. People with acute torticollis may have trouble turning their head. The condition can be painful and may range from mild to severe. What are the causes? This condition may be caused by:  Sleeping in an awkward position (common).  Extending or twisting the neck muscles beyond their normal position.  An injury to the neck muscles.  An infection.  A tumor.  Certain medicines.  Long-lasting spasms of the neck muscles.  In some cases, the cause may not be known. What increases the risk? You are more likely to develop this condition if:  You have a condition associated with loose ligaments, such as Down syndrome.  You have a brain condition that affects vision, such as strabismus.  What are the signs or symptoms? The main symptom of this condition is tilting of the head to one side. Other symptoms include:  Pain in the neck.  Trouble turning the head from side to  side or up and down.  How is this diagnosed? This condition may be diagnosed based on:  A physical exam.  Your medical history.  Imaging tests, such as: ? An X-ray. ? An ultrasound. ? A CT scan. ? An MRI.  How is this treated? Treatment for this condition depends on what is causing the condition. Mild cases may go away without treatment. Treatment for more serious cases may include:  Medicines or shots to relax the muscles.  Other medicines, such as antibiotics to treat the underlying cause.  Wearing a soft neck collar.  Physical therapy and stretching to improve neck strength and flexibility.  Neck massage.  In severe cases, surgery may be needed to repair dislocated or broken bones or to treat nerves in the neck. Follow these instructions at home:  Take over-the-counter and prescription medicines only as told by your health care provider.  Do stretching exercises and massage your neck as told by your health care provider.  If directed, apply heat to the affected area as often as told by your health care provider. Use the heat source that your health care provider recommends, such as a moist heat pack or a heating pad. ? Place a towel between your skin and the heat source. ? Leave the heat on for 20-30 minutes. ? Remove the heat if your skin turns bright red. This is especially important if you are unable to feel pain, heat, or cold. You may have a greater risk of getting burned.  If you wake up with   torticollis after sleeping, check your bed or sleeping area. Look for lumpy pillows or unusual objects. Make sure your bed and sleeping area are comfortable.  Keep all follow-up visits as told by your health care provider. This is important. Contact a health care provider if:  You have a fever.  Your symptoms do not improve or they get worse. Get help right away if:  You have trouble breathing.  You develop noisy breathing (stridor).  You start to drool.  You have  trouble swallowing or pain when swallowing.  You develop numbness or weakness in your hands or feet.  You have changes in your speech, understanding, or vision.  You are in severe pain.  You cannot move your head or neck. Summary  Torticollis is a condition in which the muscles of the neck tighten (contract) abnormally, causing the neck to twist and the head to move into an unnatural position. Torticollis that develops suddenly is called acute torticollis.  Treatment for this condition depends on what is causing the condition. Mild cases may go away without treatment.  Do stretching exercises and massage your neck as told by your health care provider. You may also be instructed to apply heat to the area.  Contact your health care provider if your symptoms do not improve or they get worse. This information is not intended to replace advice given to you by your health care provider. Make sure you discuss any questions you have with your health care provider. Document Released: 09/12/2000 Document Revised: 11/13/2016 Document Reviewed: 11/13/2016 Elsevier Interactive Patient Education  2018 Elsevier Inc.  

## 2017-03-05 NOTE — Progress Notes (Signed)
Emily FractionHeidi J Mccarthy 35 y.o.   Chief Complaint  Patient presents with  . Neck Pain    LEFT side down to LEFT shoulder since Sunday  . Medication Refill    ADDERALL  30 MG    HISTORY OF PRESENT ILLNESS: This is a 36 y.o. female complaining of left sided neck pain x 4 days; denies trauma or any other significant symptoms; denies fever, chills, sob, rash, ear/eye pain, n/v, headache, dizziness or neurological symptoms.  HPI   Prior to Admission medications   Medication Sig Start Date End Date Taking? Authorizing Provider  amphetamine-dextroamphetamine (ADDERALL) 30 MG tablet Take 1 tablet by mouth 2 (two) times daily. 11/25/16  Yes Mccarthy, Emily L, PA-C  amphetamine-dextroamphetamine (ADDERALL) 30 MG tablet Take 1 tablet by mouth 2 (two) times daily. 11/25/16  Yes Mccarthy, Emily L, PA-C  amphetamine-dextroamphetamine (ADDERALL) 30 MG tablet Take 1 tablet by mouth 2 (two) times daily. 11/25/16  Yes Mccarthy, Emily L, PA-C  clindamycin (CLEOCIN T) 1 % lotion 1 APPLICATION APPLY ON THE SKIN IN THE MORNING APPLY THIN LAYER TO FACE EVERY MORNING 11/14/15  Yes [provider]  NUVARING 0.12-0.015 MG/24HR vaginal ring  07/14/11   [provider]    No Known Allergies  Patient Active Problem List   Diagnosis Date Noted  . 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 Nicotine addiction 06/13/2012  . Left elbow pain 06/24/2011  . Right wrist injury 06/17/2011    No past medical history on file.  No past surgical history on file.  Social History   Social History  . Marital status: Single    Spouse name: N/A  . Number of children: N/A  . Years of education: N/A   Occupational History  . sale     [employer]   Social History Main Topics  . Smoking status: Former Smoker    Packs/day: 0.50  . Smokeless tobacco: Never Used  . Alcohol use Yes     Comment: up to 3 glasses wine a week  . Drug use: No  . Sexual activity: Yes   Other Topics Concern  . 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 Heart attack Neg Hx   . Sudden death Neg Hx      Review of Systems  Constitutional: Negative.  Negative for chills and fever.  HENT: Negative.  Negative for ear pain, nosebleeds, sinus pain and sore throat.   Eyes: Negative.  Negative for blurred vision and double vision.  Respiratory: Negative.  Negative for cough and shortness of breath.   Cardiovascular: Negative.  Negative for chest pain and leg swelling.  Gastrointestinal: Negative.  Negative for abdominal pain, diarrhea, nausea and vomiting.  Musculoskeletal: Positive for neck pain. Negative for back pain, joint pain and myalgias.  Skin: Negative.  Negative for rash.  Neurological: Negative for dizziness, tingling, sensory change, focal weakness and headaches.  Endo/Heme/Allergies: Negative.   All other systems reviewed and are negative.  Vitals:   03/05/17 1327  BP: 102/64  Pulse: 81  Resp: 16  Temp: 99 F (37.2 C)     Physical  Exam  Constitutional: She is oriented to person, place, and time. She appears well-developed and well-nourished.  HENT:  Head: Normocephalic and atraumatic.  Right Ear: Tympanic membrane, external ear and ear canal normal.  Left Ear: Tympanic membrane, external ear and ear canal normal.  Nose: Nose normal.  Mouth/Throat: Oropharynx is clear and moist.  Eyes: Conjunctivae and EOM are normal. Pupils are equal, round, and reactive to light.  Neck: No JVD present. Muscular tenderness present. No spinous process tenderness present. Carotid bruit is not present. Decreased range of motion present. No edema and no erythema present. No thyromegaly present.    Cardiovascular: Normal rate, regular rhythm and normal heart  sounds.   Pulmonary/Chest: Effort normal and breath sounds normal.  Neurological: She is alert and oriented to person, place, and time. No cranial nerve deficit or sensory deficit. She exhibits normal muscle tone.  Skin: Skin is warm and dry. Capillary refill takes less than 2 seconds. No rash noted.  Psychiatric: She has a normal mood and affect. Her behavior is normal.  Vitals reviewed.    ASSESSMENT & PLAN: Irania was seen today for neck pain and medication refill.  Diagnoses and all orders for this visit:  Neck muscle spasm  Neck pain  Other orders -     diclofenac (VOLTAREN) 75 MG EC tablet; Take 1 tablet (75 mg total) by mouth 2 (two) times daily. -     cyclobenzaprine (FLEXERIL) 10 MG tablet; Take 1 tablet (10 mg total) by mouth at bedtime.    Patient Instructions       IF you received an x-ray today, you will receive an invoice from [facility]. Please contact [facility] at [phone] with questions or concerns regarding your invoice.   IF you received labwork today, you will receive an invoice from [facility]. Please contact [facility] at [phone] with questions or concerns regarding your invoice.   Our billing staff will not be able to assist you with questions regarding bills from these companies.  You will be contacted with the lab results as soon as they are available. The fastest way to get your results is to activate your My Chart account. Instructions are located on the last page of this paperwork. If you have not heard from [place] regarding the results in 2 weeks, please contact this office.     Acute Torticollis, Adult Torticollis is a condition in which the muscles of the neck tighten (contract) abnormally, causing the neck to twist and the head to move into an unnatural position. Torticollis that develops suddenly is called acute torticollis. People with acute torticollis may have trouble turning their head. The condition can be painful and  may range from mild to severe. What are the causes? This condition may be caused by:  Sleeping in an awkward position (common).  Extending or twisting the neck muscles beyond their normal position.  An injury to the neck muscles.  An infection.  A tumor.  Certain medicines.  Long-lasting spasms of the neck muscles.  In some cases, the cause may not be known. What increases the risk? You are more likely to develop this condition if:  You have a condition associated with loose ligaments, such as Down syndrome.  You have a brain condition that affects vision, such as strabismus.  What are the signs or symptoms? The main symptom of this condition is tilting of the head to one side. Other symptoms include:  Pain in the neck.  Trouble turning the head from side to  side or up and down.  How is this diagnosed? This condition may be diagnosed based on:  A physical exam.  Your medical history.  Imaging tests, such as: ? An X-ray. ? An ultrasound. ? A CT scan. ? An MRI.  How is this treated? Treatment for this condition depends on what is causing the condition. Mild cases may go away without treatment. Treatment for more serious cases may include:  Medicines or shots to relax the muscles.  Other medicines, such as antibiotics to treat the underlying cause.  Wearing a soft neck collar.  Physical therapy and stretching to improve neck strength and flexibility.  Neck massage.  In severe cases, surgery may be needed to repair dislocated or broken bones or to treat nerves in the neck. Follow these instructions at home:  Take over-the-counter and prescription medicines only as told by your health care provider.  Do stretching exercises and massage your neck as told by your health care provider.  If directed, apply heat to the affected area as often as told by your health care provider. Use the heat source that your health care provider recommends, such as a moist heat  pack or a heating pad. ? Place a towel between your skin and the heat source. ? Leave the heat on for 20-30 minutes. ? Remove the heat if your skin turns bright red. This is especially important if you are unable to feel pain, heat, or cold. You may have a greater risk of getting burned.  If you wake up with torticollis after sleeping, check your bed or sleeping area. Look for lumpy pillows or unusual objects. Make sure your bed and sleeping area are comfortable.  Keep all follow-up visits as told by your health care provider. This is important. Contact a health care provider if:  You have a fever.  Your symptoms do not improve or they get worse. Get help right away if:  You have trouble breathing.  You develop noisy breathing (stridor).  You start to drool.  You have trouble swallowing or pain when swallowing.  You develop numbness or weakness in your hands or feet.  You have changes in your speech, understanding, or vision.  You are in severe pain.  You cannot move your head or neck. Summary  Torticollis is a condition in which the muscles of the neck tighten (contract) abnormally, causing the neck to twist and the head to move into an unnatural position. Torticollis that develops suddenly is called acute torticollis.  Treatment for this condition depends on what is causing the condition. Mild cases may go away without treatment.  Do stretching exercises and massage your neck as told by your health care provider. You may also be instructed to apply heat to the area.  Contact your health care provider if your symptoms do not improve or they get worse. This information is not intended to replace advice given to you by your health care provider. Make sure you discuss any questions you have with your health care provider. Document Released: 09/12/2000 Document Revised: 11/13/2016 Document Reviewed: 11/13/2016 Elsevier Interactive Patient Education  2018 Elsevier  Inc.      Edwina Barth, MD [facility]

## 2017-04-02 ENCOUNTER — Telehealth: Payer: Self-pay | Admitting: Physician Assistant

## 2017-04-02 DIAGNOSIS — F988 Other specified behavioral and emotional disorders with onset usually occurring in childhood and adolescence: Secondary | ICD-10-CM

## 2017-04-02 NOTE — Telephone Encounter (Signed)
Pt would like a refill on her amphetamine-dextroamphetamine (ADDERALL) 30 MG tablet. Please advise at [phone]

## 2017-04-03 MED ORDER — AMPHETAMINE-DEXTROAMPHETAMINE 30 MG PO TABS: 30.0000 mg | ORAL_TABLET | Freq: Two times a day (BID) | ORAL | 0 refills | Status: DC

## 2017-04-03 NOTE — Telephone Encounter (Signed)
mychart message sent to pt about making an appt °

## 2017-04-03 NOTE — Telephone Encounter (Signed)
Pt needs office visit for more refills

## 2017-04-03 NOTE — Telephone Encounter (Signed)
Pt needs to schedule an OV before more Rx are given

## 2017-04-03 NOTE — Telephone Encounter (Signed)
Please advise 

## 2017-04-07 ENCOUNTER — Ambulatory Visit (INDEPENDENT_AMBULATORY_CARE_PROVIDER_SITE_OTHER): Payer: 59 | Admitting: Emergency Medicine

## 2017-04-07 ENCOUNTER — Encounter: Payer: Self-pay | Admitting: Emergency Medicine

## 2017-04-07 VITALS — BP 103/66 | HR 74 | Temp 97.8°F | Resp 16 | Ht 69.0 in | Wt 165.6 lb

## 2017-04-07 DIAGNOSIS — F988 Other specified behavioral and emotional disorders with onset usually occurring in childhood and adolescence: Secondary | ICD-10-CM

## 2017-04-07 MED ORDER — AMPHETAMINE-DEXTROAMPHETAMINE 30 MG PO TABS: 30.0000 mg | ORAL_TABLET | Freq: Two times a day (BID) | ORAL | 0 refills | Status: DC

## 2017-04-07 NOTE — Patient Instructions (Addendum)
Amphetamine; Dextroamphetamine tablets What is this medicine? AMPHETAMINE; DEXTROAMPHETAMINE(am FET a meen; dex troe am FET a meen) is used to treat attention-deficit hyperactivity disorder (ADHD). It may also be used for narcolepsy. Federal law prohibits giving this medicine to any person other than the person for whom it was prescribed. Do not share this medicine with anyone else. This medicine may be used for other purposes; ask your health care provider or pharmacist if you have questions. COMMON BRAND NAME(S): Adderall What should I tell my health care provider before I take this medicine? They need to know if you have any of these conditions: -anxiety or panic attacks -circulation problems in fingers and toes -glaucoma -hardening or blockages of the arteries or heart blood vessels -heart disease or a heart defect -high blood pressure -history of a drug or alcohol abuse problem -history of stroke -kidney disease -liver disease -mental illness -seizures -suicidal thoughts, plans, or attempt; a previous suicide attempt by you or a family member -thyroid disease -Tourette's syndrome -an unusual or allergic reaction to dextroamphetamine, other amphetamines, other medicines, foods, dyes, or preservatives -pregnant or trying to get pregnant -breast-feeding How should I use this medicine? Take this medicine by mouth with a glass of water. Follow the directions on the prescription label. Take your doses at regular intervals. Do not take your medicine more often than directed. Do not suddenly stop your medicine. You must gradually reduce the dose or you may feel withdrawal effects. Ask your doctor or health care professional for advice. Talk to your pediatrician regarding the use of this medicine in children. Special care may be needed. While this drug may be prescribed for children as young as 3 years for selected conditions, precautions do apply. Overdosage: If you think you have taken too  much of this medicine contact a poison control center or emergency room at once. NOTE: This medicine is only for you. Do not share this medicine with others. What if I miss a dose? If you miss a dose, take it as soon as you can. If it is almost time for your next dose, take only that dose. Do not take double or extra doses. What may interact with this medicine? Do not take this medicine with any of the following medications: -MAOIS like Carbex, Eldepryl, Marplan, Nardil, and Parnate -other stimulant medicines for attention disorders, weight loss, or to stay awake This medicine may also interact with the following medications: -acetazolamide -ammonium chloride -antacids -ascorbic acid -atomoxetine -caffeine -certain medicines for blood pressure -certain medicines for depression, anxiety, or psychotic disturbances -certain medicines for seizures like carbamazepine, phenobarbital, phenytoin -certain medicines for stomach problems like cimetidine, famotidine, omeprazole, lansoprazole -cold or allergy medicines -glutamic acid -lithium -meperidine -methenamine; sodium acid phosphate -narcotic medicines for pain -norepinephrine -phenothiazines like chlorpromazine, mesoridazine, prochlorperazine, thioridazine -sodium acid phosphate -sodium bicarbonate This list may not describe all possible interactions. Give your health care provider a list of all the medicines, herbs, non-prescription drugs, or dietary supplements you use. Also tell them if you smoke, drink alcohol, or use illegal drugs. Some items may interact with your medicine. What should I watch for while using this medicine? Visit your doctor or health care professional for regular checks on your progress. This prescription requires that you follow special procedures with your doctor and pharmacy. You will need to have a new written prescription from your doctor every time you need a refill. This medicine may affect your  concentration, or hide signs of tiredness. Until you know how this   medicine affects you, do not drive, ride a bicycle, use machinery, or do anything that needs mental alertness. Tell your doctor or health care professional if this medicine loses its effects, or if you feel you need to take more than the prescribed amount. Do not change the dosage without talking to your doctor or health care professional. Decreased appetite is a common side effect when starting this medicine. Eating small, frequent meals or snacks can help. Talk to your doctor if you continue to have poor eating habits. Height and weight growth of a child taking this medicine will be monitored closely. Do not take this medicine close to bedtime. It may prevent you from sleeping. If you are going to need surgery, a MRI, CT scan, or other procedure, tell your doctor that you are taking this medicine. You may need to stop taking this medicine before the procedure. Tell your doctor or healthcare professional right away if you notice unexplained wounds on your fingers and toes while taking this medicine. You should also tell your healthcare provider if you experience numbness or pain, changes in the skin color, or sensitivity to temperature in your fingers or toes. What side effects may I notice from receiving this medicine? Side effects that you should report to your doctor or health care professional as soon as possible: -allergic reactions like skin rash, itching or hives, swelling of the face, lips, or tongue -changes in vision -chest pain or chest tightness -confusion, trouble speaking or understanding -fast, irregular heartbeat -fingers or toes feel numb, cool, painful -hallucination, loss of contact with reality -high blood pressure -males: prolonged or painful erection -seizures -severe headaches -shortness of breath -suicidal thoughts or other mood changes -trouble walking, dizziness, loss of balance or  coordination -uncontrollable head, mouth, neck, arm, or leg movements Side effects that usually do not require medical attention (report to your doctor or health care professional if they continue or are bothersome): -anxious -headache -loss of appetite -nausea, vomiting -trouble sleeping -weight loss This list may not describe all possible side effects. Call your doctor for medical advice about side effects. You may report side effects to FDA at 1-800-FDA-1088. Where should I keep my medicine? Keep out of the reach of children. This medicine can be abused. Keep your medicine in a safe place to protect it from theft. Do not share this medicine with anyone. Selling or giving away this medicine is dangerous and against the law. Store at room temperature between 15 and 30 degrees C (59 and 86 degrees F). Keep container tightly closed. Throw away any unused medicine after the expiration date. Dispose of properly. This medicine may cause accidental overdose and death if it is taken by other adults, children, or pets. Mix any unused medicine with a substance like cat litter or coffee grounds. Then throw the medicine away in a sealed container like a sealed bag or a coffee can with a lid. Do not use the medicine after the expiration date. NOTE: This sheet is a summary. It may not cover all possible information. If you have questions about this medicine, talk to your doctor, pharmacist, or health care provider.  2018 Elsevier/Gold Standard (2014-07-19 18:44:41)        IF you received an x-ray today, you will receive an invoice from [facility]. Please contact [facility] at [phone] with questions or concerns regarding your invoice.   IF you received labwork today, you will receive an invoice from [facility]. Please contact [facility] at [phone] with questions or   concerns regarding your invoice.   Our billing staff will not be able to assist you with questions regarding  bills from these companies.  You will be contacted with the lab results as soon as they are available. The fastest way to get your results is to activate your My Chart account. Instructions are located on the last page of this paperwork. If you have not heard from us regarding the results in 2 weeks, please contact this office.      

## 2017-04-07 NOTE — Progress Notes (Signed)
Emily Mccarthy 36 y.o.   Chief Complaint  Patient presents with  . Medication Refill    ADDERALL    HISTORY OF PRESENT ILLNESS: This is a 36 y.o. female here for Adderall refill; no complaints or medical concerns.  HPI   Prior to Admission medications   Medication Sig Start Date End Date Taking? Authorizing Provider  amphetamine-dextroamphetamine (ADDERALL) 30 MG tablet Take 1 tablet by mouth 2 (two) times daily. 11/25/16  Yes Weber, Sarah L, PA-C  amphetamine-dextroamphetamine (ADDERALL) 30 MG tablet Take 1 tablet by mouth 2 (two) times daily. 11/25/16  Yes Weber, Sarah L, PA-C  amphetamine-dextroamphetamine (ADDERALL) 30 MG tablet Take 1 tablet by mouth 2 (two) times daily. 04/03/17  Yes Weber, Sarah L, PA-C  clindamycin (CLEOCIN T) 1 % lotion 1 APPLICATION APPLY ON THE SKIN IN THE MORNING APPLY THIN LAYER TO FACE EVERY MORNING 11/14/15  Yes [provider]  NUVARING 0.12-0.015 MG/24HR vaginal ring  07/14/11  Yes [provider]  cyclobenzaprine (FLEXERIL) 10 MG tablet Take 1 tablet (10 mg total) by mouth at bedtime. Patient not taking: Reported on 04/07/2017 03/05/17   Georgina QuintSagardia, Ludie Pavlik Jose, MD    No Known Allergies  Patient Active Problem List   Diagnosis Date Noted  . Neck muscle spasm 03/05/2017  . Neck pain 03/05/2017  . 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 Nicotine addiction 06/13/2012  . Left elbow pain 06/24/2011  . Right wrist injury 06/17/2011    No past medical history on file.  No past surgical history on file.  Social History   Social History  . Marital status: Single    Spouse name: N/A  . Number of children: N/A  . Years of education: N/A   Occupational History  . sale     [employer]   Social History Main Topics  . Smoking status: Former Smoker    Packs/day: 0.50  . Smokeless tobacco: Never Used  . Alcohol use Yes     Comment: up to 3 glasses wine a week  . Drug use: No  . Sexual activity: Yes   Other Topics  Concern  . 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 Heart attack Neg Hx   . Sudden death Neg Hx      Review of Systems  Constitutional: Negative.  Negative for chills and fever.  HENT: Negative.   Eyes: Negative.   Respiratory: Negative.  Negative for shortness of breath.   Cardiovascular: Negative.  Negative for chest pain and palpitations.  Gastrointestinal: Negative.  Negative for abdominal pain, diarrhea, nausea and vomiting.  Genitourinary: Negative.   Skin: Negative.  Negative for rash.  Neurological: Negative.  Negative for dizziness and headaches.  Endo/Heme/Allergies: Negative.   All other systems reviewed and are negative.  Vitals:   04/07/17 1014  BP: 103/66  Pulse: 74  Resp: 16  Temp: 97.8 F (36.6 C)     Physical Exam  Constitutional: She is oriented to person, place, and time. She appears well-developed and well-nourished.  HENT:  Head: Normocephalic and atraumatic.  Eyes: Conjunctivae and EOM are normal. Pupils are  equal, round, and reactive to light.  Neck: Normal range of motion. Neck supple.  Cardiovascular: Normal rate, regular rhythm and normal heart sounds.   Pulmonary/Chest: Effort normal and breath sounds normal.  Abdominal: Soft. There is no tenderness.  Musculoskeletal: Normal range of motion.  Neurological: She is alert and oriented to person, place, and time. No sensory deficit. She exhibits normal muscle tone.  Skin: Skin is warm and dry. Capillary refill takes less than 2 seconds.  Psychiatric: She has a normal mood and affect. Her behavior is normal.  Vitals reviewed.    ASSESSMENT & PLAN: [name] was seen today for medication refill.  Diagnoses and all orders for this  visit:  Attention deficit disorder (ADD) without hyperactivity -     amphetamine-dextroamphetamine (ADDERALL) 30 MG tablet; Take 1 tablet by mouth 2 (two) times daily.   Patient Instructions   Amphetamine; Dextroamphetamine tablets What is this medicine? AMPHETAMINE; DEXTROAMPHETAMINE(am FET a meen; dex troe am FET a meen) is used to treat attention-deficit hyperactivity disorder (ADHD). It may also be used for narcolepsy. Federal law prohibits giving this medicine to any person other than the person for whom it was prescribed. Do not share this medicine with anyone else. This medicine may be used for other purposes; ask your health care provider or pharmacist if you have questions. COMMON BRAND NAME(S): Adderall What should I tell my health care provider before I take this medicine? They need to know if you have any of these conditions: -anxiety or panic attacks -circulation problems in fingers and toes -glaucoma -hardening or blockages of the arteries or heart blood vessels -heart disease or a heart defect -high blood pressure -history of a drug or alcohol abuse problem -history of stroke -kidney disease -liver disease -mental illness -seizures -suicidal thoughts, plans, or attempt; a previous suicide attempt by you or a family member -thyroid disease -Tourette's syndrome -an unusual or allergic reaction to dextroamphetamine, other amphetamines, other medicines, foods, dyes, or preservatives -pregnant or trying to get pregnant -breast-feeding How should I use this medicine? Take this medicine by mouth with a glass of water. Follow the directions on the prescription label. Take your doses at regular intervals. Do not take your medicine more often than directed. Do not suddenly stop your medicine. You must gradually reduce the dose or you may feel withdrawal effects. Ask your doctor or health care professional for advice. Talk to your pediatrician regarding the use of this medicine  in children. Special care may be needed. While this drug may be prescribed for children as young as 3 years for selected conditions, precautions do apply. Overdosage: If you think you have taken too much of this medicine contact a poison control center or emergency room at once. NOTE: This medicine is only for you. Do not share this medicine with others. What if I miss a dose? If you miss a dose, take it as soon as you can. If it is almost time for your next dose, take only that dose. Do not take double or extra doses. What may interact with this medicine? Do not take this medicine with any of the following medications: -MAOIS like Carbex, Eldepryl, Marplan, Nardil, and Parnate -other stimulant medicines for attention disorders, weight loss, or to stay awake This medicine may also interact with the following medications: -acetazolamide -ammonium chloride -antacids -ascorbic acid -atomoxetine -caffeine -certain medicines for blood pressure -certain medicines for depression, anxiety, or psychotic disturbances -certain medicines for seizures like carbamazepine, phenobarbital, phenytoin -certain medicines for stomach  problems like cimetidine, famotidine, omeprazole, lansoprazole -cold or allergy medicines -glutamic acid -lithium -meperidine -methenamine; sodium acid phosphate -narcotic medicines for pain -norepinephrine -phenothiazines like chlorpromazine, mesoridazine, prochlorperazine, thioridazine -sodium acid phosphate -sodium bicarbonate This list may not describe all possible interactions. Give your health care provider a list of all the medicines, herbs, non-prescription drugs, or dietary supplements you use. Also tell them if you smoke, drink alcohol, or use illegal drugs. Some items may interact with your medicine. What should I watch for while using this medicine? Visit your doctor or health care professional for regular checks on your progress. This prescription requires that  you follow special procedures with your doctor and pharmacy. You will need to have a new written prescription from your doctor every time you need a refill. This medicine may affect your concentration, or hide signs of tiredness. Until you know how this medicine affects you, do not drive, ride a bicycle, use machinery, or do anything that needs mental alertness. Tell your doctor or health care professional if this medicine loses its effects, or if you feel you need to take more than the prescribed amount. Do not change the dosage without talking to your doctor or health care professional. Decreased appetite is a common side effect when starting this medicine. Eating small, frequent meals or snacks can help. Talk to your doctor if you continue to have poor eating habits. Height and weight growth of a child taking this medicine will be monitored closely. Do not take this medicine close to bedtime. It may prevent you from sleeping. If you are going to need surgery, a MRI, CT scan, or other procedure, tell your doctor that you are taking this medicine. You may need to stop taking this medicine before the procedure. Tell your doctor or healthcare professional right away if you notice unexplained wounds on your fingers and toes while taking this medicine. You should also tell your healthcare provider if you experience numbness or pain, changes in the skin color, or sensitivity to temperature in your fingers or toes. What side effects may I notice from receiving this medicine? Side effects that you should report to your doctor or health care professional as soon as possible: -allergic reactions like skin rash, itching or hives, swelling of the face, lips, or tongue -changes in vision -chest pain or chest tightness -confusion, trouble speaking or understanding -fast, irregular heartbeat -fingers or toes feel numb, cool, painful -hallucination, loss of contact with reality -high blood pressure -males:  prolonged or painful erection -seizures -severe headaches -shortness of breath -suicidal thoughts or other mood changes -trouble walking, dizziness, loss of balance or coordination -uncontrollable head, mouth, neck, arm, or leg movements Side effects that usually do not require medical attention (report to your doctor or health care professional if they continue or are bothersome): -anxious -headache -loss of appetite -nausea, vomiting -trouble sleeping -weight loss This list may not describe all possible side effects. Call your doctor for medical advice about side effects. You may report side effects to FDA at 1-800-FDA-1088. Where should I keep my medicine? Keep out of the reach of children. This medicine can be abused. Keep your medicine in a safe place to protect it from theft. Do not share this medicine with anyone. Selling or giving away this medicine is dangerous and against the law. Store at room temperature between 15 and 30 degrees C (59 and 86 degrees F). Keep container tightly closed. Throw away any unused medicine after the expiration date. Dispose of properly. This  medicine may cause accidental overdose and death if it is taken by other adults, children, or pets. Mix any unused medicine with a substance like cat litter or coffee grounds. Then throw the medicine away in a sealed container like a sealed bag or a coffee can with a lid. Do not use the medicine after the expiration date. NOTE: This sheet is a summary. It may not cover all possible information. If you have questions about this medicine, talk to your doctor, pharmacist, or health care provider.  2018 Elsevier/Gold Standard (2014-07-19 18:44:41)     IF you received an x-ray today, you will receive an invoice from [facility]. Please contact [facility] at [phone] with questions or concerns regarding your invoice.   IF you received labwork today, you will receive an invoice from [facility]. Please  contact [facility] at [phone] with questions or concerns regarding your invoice.   Our billing staff will not be able to assist you with questions regarding bills from these companies.  You will be contacted with the lab results as soon as they are available. The fastest way to get your results is to activate your My Chart account. Instructions are located on the last page of this paperwork. If you have not heard from [place] regarding the results in 2 weeks, please contact this office.         Edwina Barth, MD [facility]

## 2017-04-27 ENCOUNTER — Encounter: Payer: Self-pay | Admitting: Physician Assistant

## 2017-04-27 ENCOUNTER — Ambulatory Visit (INDEPENDENT_AMBULATORY_CARE_PROVIDER_SITE_OTHER): Payer: 59 | Admitting: Physician Assistant

## 2017-04-27 DIAGNOSIS — F988 Other specified behavioral and emotional disorders with onset usually occurring in childhood and adolescence: Secondary | ICD-10-CM | POA: Diagnosis not present

## 2017-04-27 MED ORDER — AMPHETAMINE-DEXTROAMPHETAMINE 30 MG PO TABS: 30.0000 mg | ORAL_TABLET | Freq: Two times a day (BID) | ORAL | 0 refills | Status: DC

## 2017-04-27 NOTE — Progress Notes (Signed)
Emily FractionHeidi J Mccarthy  MRN: [id number] DOB: 09/13/1981  PCP: Morrell RiddleWeber, Sarah L, PA-C  Chief Complaint  Patient presents with  . Medication Refill    adderall     Subjective:  Pt presents to clinic for medication refills.  She is getting the control that she needs from the Adderall. Her last Rx was a little confusing.  She is not having any side effects from her medications.  History is obtained by patient.  Review of Systems  Constitutional: Negative for appetite change, chills, fever and unexpected weight change.  Cardiovascular: Negative for chest pain and palpitations.  Psychiatric/Behavioral: Negative for sleep disturbance.    Patient Active Problem List   Diagnosis Date Noted  . Neck muscle spasm 03/05/2017  . Neck pain 03/05/2017  . 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 Nicotine addiction 06/13/2012  . Left elbow pain 06/24/2011  . Right wrist injury 06/17/2011    Current Outpatient Prescriptions on File Prior to Visit  Medication Sig Dispense Refill  . clindamycin (CLEOCIN T) 1 % lotion 1 APPLICATION APPLY ON THE SKIN IN THE MORNING APPLY THIN LAYER TO FACE EVERY MORNING  12  . NUVARING 0.12-0.015 MG/24HR vaginal ring     . cyclobenzaprine (FLEXERIL) 10 MG tablet Take 1 tablet (10 mg total) by mouth at bedtime. (Patient not taking: Reported on 04/07/2017) 30 tablet 0   No current facility-administered medications on file prior to visit.     No Known Allergies  History reviewed. No pertinent past medical history. Social History   Social History Narrative   Marital status: no   Children: no   Lives with: boyfriend   Employment: [employer]   Tobacco:  no   Alcohol:  weekly   Drugs:  no   Exercise:  5x/week   Seatbelt: 100%   Guns in home: no         Social History  Substance Use Topics  . Smoking status: Former Smoker    Packs/day: 0.50  . Smokeless tobacco: Never Used  . Alcohol use Yes     Comment: up to 3 glasses wine a week   family history includes  Heart attack in her father.     Objective:  BP 106/69   Pulse 74   Temp 98.6 F (37 C) (Oral)   Resp 18   Ht 5\' 9"  (1.753 m)   Wt 166 lb (75.3 kg)   LMP 04/19/2017   SpO2 98%   BMI 24.51 kg/m  Body mass index is 24.51 kg/m.  Physical Exam  Constitutional: She is oriented to person, place, and time and well-developed, well-nourished, and in no distress.  HENT:  Head: Normocephalic and atraumatic.  Right Ear: Hearing and external ear normal.  Left Ear: Hearing and external ear normal.  Eyes: Conjunctivae are normal.  Neck: Normal range of motion.  Cardiovascular: Normal rate, regular rhythm and normal heart sounds.   No murmur heard. Pulmonary/Chest: Effort normal and breath sounds normal. She has no wheezes.  Neurological: She is alert and oriented to person, place, and time. Gait normal.  Skin: Skin is warm and dry.  Psychiatric: Mood, memory, affect and judgment normal.  Vitals reviewed.    Wt Readings from Last 3 Encounters:  04/27/17 166 lb (75.3 kg)  04/07/17 165 lb 9.6 oz (75.1 kg)  03/05/17 165 lb 3.2 oz (74.9 kg)     Assessment and Plan :  Attention deficit disorder (ADD) without hyperactivity - Plan: amphetamine-dextroamphetamine (ADDERALL) 30 MG tablet, amphetamine-dextroamphetamine (ADDERALL) 30 MG  tablet, amphetamine-dextroamphetamine (ADDERALL) 30 MG tablet   Rx for the next 3 months - contact me for the next 3 months - recheck with me in 6 months.  Benny LennertSarah Weber PA-C  [facility] 04/27/2017 4:43 PM

## 2017-04-27 NOTE — Patient Instructions (Signed)
     IF you received an x-ray today, you will receive an invoice from [facility]. Please contact [facility] at [phone] with questions or concerns regarding your invoice.   IF you received labwork today, you will receive an invoice from [facility]. Please contact [facility] at [phone] with questions or concerns regarding your invoice.   Our billing staff will not be able to assist you with questions regarding bills from these companies.  You will be contacted with the lab results as soon as they are available. The fastest way to get your results is to activate your My Chart account. Instructions are located on the last page of this paperwork. If you have not heard from us regarding the results in 2 weeks, please contact this office.     

## 2017-04-28 ENCOUNTER — Encounter: Payer: Self-pay | Admitting: Physician Assistant

## 2017-07-14 ENCOUNTER — Telehealth: Payer: Self-pay

## 2017-07-14 NOTE — Telephone Encounter (Signed)
Spoke to patient to update overdue health maintenance, and I advised her that she is in need of a flu shot and a pap smear.  She stated that she would go to GYN for pap, and she will either come here or go to pharmacy for flu vaccine. I advised patient that if she does go elsewhere for flu shot, to have them send [place] a copy so we can update her medical record. She was very happy that we called her.

## 2017-07-15 ENCOUNTER — Other Ambulatory Visit: Payer: Self-pay | Admitting: Physician Assistant

## 2017-07-15 DIAGNOSIS — F988 Other specified behavioral and emotional disorders with onset usually occurring in childhood and adolescence: Secondary | ICD-10-CM

## 2017-07-17 ENCOUNTER — Encounter: Payer: Self-pay | Admitting: Physician Assistant

## 2017-07-17 MED ORDER — AMPHETAMINE-DEXTROAMPHETAMINE 30 MG PO TABS: 30.0000 mg | ORAL_TABLET | Freq: Two times a day (BID) | ORAL | 0 refills | Status: DC

## 2017-08-31 DIAGNOSIS — R87810 Cervical high risk human papillomavirus (HPV) DNA test positive: Secondary | ICD-10-CM | POA: Insufficient documentation

## 2017-09-02 ENCOUNTER — Encounter: Payer: Self-pay | Admitting: Emergency Medicine

## 2017-09-02 ENCOUNTER — Ambulatory Visit: Payer: 59 | Admitting: Emergency Medicine

## 2017-09-02 VITALS — BP 122/72 | HR 80 | Temp 98.4°F | Resp 17 | Ht 69.0 in | Wt 174.0 lb

## 2017-09-02 DIAGNOSIS — R1084 Generalized abdominal pain: Secondary | ICD-10-CM | POA: Insufficient documentation

## 2017-09-02 DIAGNOSIS — M25532 Pain in left wrist: Secondary | ICD-10-CM | POA: Diagnosis not present

## 2017-09-02 NOTE — Progress Notes (Signed)
Emily FractionHeidi J Mccarthy 36 y.o.   Chief Complaint  Patient presents with  . Wrist Pain    left side   . Abdominal Pain  . Nausea    HISTORY OF PRESENT ILLNESS: This is a 36 y.o. female complaining of pain to left wrist x 1 month; right handed; works with computer/keyboard a lot; does yoga; denies trauma. Denies neurological symptoms to fingers. Also 2 weeks ago had sudden onset of abdominal cramping with explosive diarrhea and nausea; got better the next day; had recent GYN exam which was normal.   HPI   Prior to Admission medications   Medication Sig Start Date End Date Taking? Authorizing Provider  amphetamine-dextroamphetamine (ADDERALL) 30 MG tablet Take 1 tablet by mouth 2 (two) times daily. 07/17/17  Yes Weber, Sarah L, PA-C  amphetamine-dextroamphetamine (ADDERALL) 30 MG tablet Take 1 tablet by mouth 2 (two) times daily. 07/17/17  Yes Weber, Sarah L, PA-C  amphetamine-dextroamphetamine (ADDERALL) 30 MG tablet Take 1 tablet by mouth 2 (two) times daily. 07/17/17  Yes Weber, Dema SeverinSarah L, PA-C  NUVARING 0.12-0.015 MG/24HR vaginal ring  07/14/11  Yes [provider]  clindamycin (CLEOCIN T) 1 % lotion 1 APPLICATION APPLY ON THE SKIN IN THE MORNING APPLY THIN LAYER TO FACE EVERY MORNING 11/14/15   [provider]    No Known Allergies  Patient Active Problem List   Diagnosis Date Noted  . Neck muscle spasm 03/05/2017  . Neck pain 03/05/2017  . 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 Nicotine addiction 06/13/2012  . Left elbow pain 06/24/2011  . Right wrist injury 06/17/2011    No past medical history on file.  No past surgical history on file.  Social History   Socioeconomic History  . Marital status: Single    Spouse name: Not on file  . Number of children: Not on file  . Years of education: Not on file  . Highest education level: Not on file  Social Needs  . Financial resource strain: Not on file  . Food insecurity - worry: Not on file  . Food  insecurity - inability: Not on file  . Transportation needs - medical: Not on file  . Transportation needs - non-medical: Not on file  Occupational History  . 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 Smoking status: Former Smoker    Packs/day: 0.50  . Smokeless tobacco: Never Used  Substance and Sexual Activity  . Alcohol use: Yes    Comment: up to 3 glasses wine a week  . Drug use: No  . Sexual activity: Yes    Birth control/protection: Other-see comments    Comment: nuvaring  Other Topics Concern  . 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 Heart attack Father   . Sudden death Neg Hx      Review of Systems  Constitutional: Negative.  Negative for chills, fever and malaise/fatigue.  HENT: Negative.   Eyes: Negative.   Respiratory: Negative.  Negative for cough and shortness of breath.   Cardiovascular: Negative.  Negative for chest pain and palpitations.  Gastrointestinal: Negative.  Negative for abdominal pain, diarrhea and vomiting.  Musculoskeletal: Positive for joint pain (left wrist).  Skin: Negative.  Negative for rash.  Neurological: Negative.  Negative for sensory change and focal weakness.  Endo/Heme/Allergies: Negative.   Psychiatric/Behavioral: Negative.   All other systems reviewed and are negative.  Vitals:   09/02/17 1527  BP: 122/72  Pulse: 80  Resp: 17  Temp: 98.4 F (36.9 C)  SpO2: 98%     Physical Exam  Constitutional: She is oriented to person, place, and time. She appears well-developed and well-nourished.  HENT:  Head: Normocephalic and atraumatic.  Mouth/Throat: Oropharynx is clear and moist.  Eyes: Conjunctivae and EOM are normal. Pupils are equal, round, and  reactive to light.  Neck: Normal range of motion. Neck supple.  Cardiovascular: Normal rate, regular rhythm and normal heart sounds.  Pulmonary/Chest: Effort normal and breath sounds normal.  Abdominal: Soft. Bowel sounds are normal. She exhibits no distension. There is no tenderness. There is no guarding.  Musculoskeletal:  Left wrist: no erythema or bruising, no swelling; mild tenderness ulnar volar aspect, FROM, NVI  Neurological: She is alert and oriented to person, place, and time. No sensory deficit. She exhibits normal muscle tone.  Skin: Skin is warm and dry. Capillary refill takes less than 2 seconds. No rash noted.  Psychiatric: She has a normal mood and affect. Her behavior is normal.  Vitals reviewed.    ASSESSMENT & PLAN: Emily BucklerHeidi was seen today for wrist pain, abdominal pain and nausea.  Diagnoses and all orders for this visit:  Wrist pain, acute, left Comments: tendonitis most likely  Generalized abdominal pain Comments: resolved    Patient Instructions       IF you received an x-ray today, you will receive an invoice from [facility]. Please contact [facility] at [phone] with questions or concerns regarding your invoice.   IF you received labwork today, you will receive an invoice from [facility]. Please contact [facility] at [phone] with questions or concerns regarding your invoice.   Our billing staff will not be able to assist you with questions regarding bills from these companies.  You will be contacted with the lab results as soon as they are available. The fastest way to get your results is to activate your My Chart account. Instructions are located on the last page of this paperwork. If you have not heard from us regarding the results in 2 weeks, please contact this office.      Tendinitis Tendinitis is swelling (inflammation) of a tendon. A tendon is cord of tissue that connects muscle to bone. Tendinitis can cause pain,  tenderness, and swelling. It is usually treated with RICE therapy. RICE stands for:  Rest.  Ice.  Compression. This means putting pressure on the affected area.  Elevation. This means raising the affected area above the level of your heart.  Follow these instructions at home: If you have a splint or brace:  Wear the splint or brace as told by your doctor. Remove it only as told by your doctor.  Loosen the splint or brace if your fingers or toes tingle, become numb, or turn cold and blue.  Do not take baths, swim, or use a hot tub until your doctor approves. Ask your doctor if you can take showers. You may only be able to take sponge baths for bathing.  Do not let your splint or brace get wet if it is not waterproof. ? If your splint or brace is not waterproof, cover it  with a watertight plastic bag when you take a bath or a shower.  Keep the splint or brace clean. Managing pain, stiffness, and swelling  If directed, apply ice to the affected area. ? Put ice in a plastic bag. ? Place a towel between your skin and the bag. ? Leave the ice on for 20 minutes, 2-3 times a day.  If directed, apply heat to the affected area as often as told by your doctor. Use the heat source that your doctor recommends. ? Place a towel between your skin and the heat source. ? Leave the heat on for 20-30 minutes. ? Take off the heat if your skin turns bright red. This is especially important if you are unable to feel pain, heat, or cold. You may have a greater risk of getting burned.  Move the fingers or toes of the affected arm or leg often, if this applies. This helps to prevent stiffness and to lessen swelling.  If directed, raise the affected area above the level of your heart while you are sitting or lying down. Driving  Do not drive or use heavy machinery while taking prescription pain medicine.  Ask your doctor when it is safe to drive if you have a splint or brace on any part of your arm or  leg. Activity  Return to your normal activities as told by your doctor. Ask your doctor what activities are safe for you.  Rest the affected area as told by your doctor.  Avoid using the affected area while you have tendinitis.  Do exercises (physical therapy) as told by your doctor. General instructions  If you have a splint, do not put pressure on any part of the splint until it is fully hardened. This may take several hours.  Wear an elastic bandage or pressure (compression) wrap only as told by your doctor.  Take over-the-counter and prescription medicines only as told by your doctor.  Keep all follow-up visits as told by your doctor. This is important. Contact a doctor if:  You do not get better.  You get new problems, such as numbness in your hands, and you do not know why. This information is not intended to replace advice given to you by your health care provider. Make sure you discuss any questions you have with your health care provider. Document Released: 12/26/2010 Document Revised: 05/15/2016 Document Reviewed: 06/18/2015 Elsevier Interactive Patient Education  2018 [employer].      Edwina Barth, MD [facility]

## 2017-09-02 NOTE — Patient Instructions (Addendum)
   IF you received an x-ray today, you will receive an invoice from [facility]. Please contact [facility] at [phone] with questions or concerns regarding your invoice.   IF you received labwork today, you will receive an invoice from [facility]. Please contact [facility] at [phone] with questions or concerns regarding your invoice.   Our billing staff will not be able to assist you with questions regarding bills from these companies.  You will be contacted with the lab results as soon as they are available. The fastest way to get your results is to activate your My Chart account. Instructions are located on the last page of this paperwork. If you have not heard from us regarding the results in 2 weeks, please contact this office.     Tendinitis Tendinitis is swelling (inflammation) of a tendon. A tendon is cord of tissue that connects muscle to bone. Tendinitis can cause pain, tenderness, and swelling. It is usually treated with RICE therapy. RICE stands for:  Rest.  Ice.  Compression. This means putting pressure on the affected area.  Elevation. This means raising the affected area above the level of your heart.  Follow these instructions at home: If you have a splint or brace:  Wear the splint or brace as told by your doctor. Remove it only as told by your doctor.  Loosen the splint or brace if your fingers or toes tingle, become numb, or turn cold and blue.  Do not take baths, swim, or use a hot tub until your doctor approves. Ask your doctor if you can take showers. You may only be able to take sponge baths for bathing.  Do not let your splint or brace get wet if it is not waterproof. ? If your splint or brace is not waterproof, cover it with a watertight plastic bag when you take a bath or a shower.  Keep the splint or brace clean. Managing pain, stiffness, and swelling  If directed, apply ice to the affected area. ? Put ice in a plastic  bag. ? Place a towel between your skin and the bag. ? Leave the ice on for 20 minutes, 2-3 times a day.  If directed, apply heat to the affected area as often as told by your doctor. Use the heat source that your doctor recommends. ? Place a towel between your skin and the heat source. ? Leave the heat on for 20-30 minutes. ? Take off the heat if your skin turns bright red. This is especially important if you are unable to feel pain, heat, or cold. You may have a greater risk of getting burned.  Move the fingers or toes of the affected arm or leg often, if this applies. This helps to prevent stiffness and to lessen swelling.  If directed, raise the affected area above the level of your heart while you are sitting or lying down. Driving  Do not drive or use heavy machinery while taking prescription pain medicine.  Ask your doctor when it is safe to drive if you have a splint or brace on any part of your arm or leg. Activity  Return to your normal activities as told by your doctor. Ask your doctor what activities are safe for you.  Rest the affected area as told by your doctor.  Avoid using the affected area while you have tendinitis.  Do exercises (physical therapy) as told by your doctor. General instructions  If you have a splint, do not put pressure on any   part of the splint until it is fully hardened. This may take several hours.  Wear an elastic bandage or pressure (compression) wrap only as told by your doctor.  Take over-the-counter and prescription medicines only as told by your doctor.  Keep all follow-up visits as told by your doctor. This is important. Contact a doctor if:  You do not get better.  You get new problems, such as numbness in your hands, and you do not know why. This information is not intended to replace advice given to you by your health care provider. Make sure you discuss any questions you have with your health care provider. Document Released:  12/26/2010 Document Revised: 05/15/2016 Document Reviewed: 06/18/2015 Elsevier Interactive Patient Education  2018 [employer] Inc.  

## 2017-10-09 ENCOUNTER — Other Ambulatory Visit: Payer: Self-pay | Admitting: Physician Assistant

## 2017-10-09 DIAGNOSIS — F988 Other specified behavioral and emotional disorders with onset usually occurring in childhood and adolescence: Secondary | ICD-10-CM

## 2017-10-09 NOTE — Telephone Encounter (Signed)
Refill req Adderall sent to [facility]

## 2017-10-15 MED ORDER — AMPHETAMINE-DEXTROAMPHETAMINE 30 MG PO TABS: 30.0000 mg | ORAL_TABLET | Freq: Two times a day (BID) | ORAL | 0 refills | Status: DC

## 2017-10-15 NOTE — Telephone Encounter (Signed)
Done sent to pharmacy

## 2017-12-11 ENCOUNTER — Encounter: Payer: Self-pay | Admitting: Physician Assistant

## 2017-12-14 NOTE — Telephone Encounter (Signed)
Pt called to ask if the PA form for her adderall has been received yet, but contact pt to update her on the status pt also like to receive messages on her mychart

## 2017-12-15 ENCOUNTER — Encounter: Payer: Self-pay | Admitting: Physician Assistant

## 2017-12-15 NOTE — Telephone Encounter (Signed)
PA submitted.  Received approval [id number].  Pharmacy notified and l/m for pt.

## 2017-12-15 NOTE — Telephone Encounter (Signed)
Can we check on a prior authorization on her adderall.

## 2017-12-18 ENCOUNTER — Telehealth: Payer: Self-pay

## 2017-12-18 NOTE — Telephone Encounter (Signed)
Notice from insurance company Amphetamine/Dextroamphetamine approved 12/15/2017 through 12/16/2018

## 2018-02-12 ENCOUNTER — Other Ambulatory Visit: Payer: Self-pay | Admitting: Physician Assistant

## 2018-02-12 DIAGNOSIS — F988 Other specified behavioral and emotional disorders with onset usually occurring in childhood and adolescence: Secondary | ICD-10-CM

## 2018-02-12 MED ORDER — AMPHETAMINE-DEXTROAMPHETAMINE 30 MG PO TABS: 30.0000 mg | ORAL_TABLET | Freq: Two times a day (BID) | ORAL | 0 refills | Status: DC

## 2018-02-12 NOTE — Telephone Encounter (Signed)
Patient is requesting a refill of the following medications: Requested Prescriptions   Pending Prescriptions Disp Refills  . amphetamine-dextroamphetamine (ADDERALL) 30 MG tablet 60 tablet 0    Sig: Take 1 tablet by mouth 2 (two) times daily.  Marland Kitchen amphetamine-dextroamphetamine (ADDERALL) 30 MG tablet 60 tablet 0    Sig: Take 1 tablet by mouth 2 (two) times daily.  Marland Kitchen amphetamine-dextroamphetamine (ADDERALL) 30 MG tablet 60 tablet 0    Sig: Take 1 tablet by mouth 2 (two) times daily.    Date of patient request: 02/12/2018 Last office visit: 04/27/2017 Date of last refill: 10/15/2017 Last refill amount: 60 Follow up time period per chart:

## 2018-02-12 NOTE — Telephone Encounter (Signed)
Please have the patient make an appt with me this month before her medications run out.

## 2018-03-11 ENCOUNTER — Other Ambulatory Visit: Payer: Self-pay | Admitting: Physician Assistant

## 2018-03-11 DIAGNOSIS — F988 Other specified behavioral and emotional disorders with onset usually occurring in childhood and adolescence: Secondary | ICD-10-CM

## 2018-03-12 MED ORDER — AMPHETAMINE-DEXTROAMPHETAMINE 30 MG PO TABS: 30.0000 mg | ORAL_TABLET | Freq: Two times a day (BID) | ORAL | 0 refills | Status: DC

## 2018-03-12 NOTE — Telephone Encounter (Signed)
Pt req refills of adderall Sent to [facility]

## 2018-03-16 ENCOUNTER — Encounter: Payer: Self-pay | Admitting: Physician Assistant

## 2018-07-07 ENCOUNTER — Other Ambulatory Visit: Payer: Self-pay

## 2018-07-07 ENCOUNTER — Ambulatory Visit (INDEPENDENT_AMBULATORY_CARE_PROVIDER_SITE_OTHER): Payer: BLUE CROSS/BLUE SHIELD

## 2018-07-07 ENCOUNTER — Ambulatory Visit (INDEPENDENT_AMBULATORY_CARE_PROVIDER_SITE_OTHER): Payer: BLUE CROSS/BLUE SHIELD | Admitting: Family Medicine

## 2018-07-07 ENCOUNTER — Encounter: Payer: Self-pay | Admitting: Family Medicine

## 2018-07-07 VITALS — BP 115/77 | HR 85 | Temp 98.3°F | Ht 69.0 in | Wt 172.0 lb

## 2018-07-07 DIAGNOSIS — Z23 Encounter for immunization: Secondary | ICD-10-CM | POA: Diagnosis not present

## 2018-07-07 DIAGNOSIS — M546 Pain in thoracic spine: Secondary | ICD-10-CM

## 2018-07-07 DIAGNOSIS — R3129 Other microscopic hematuria: Secondary | ICD-10-CM | POA: Diagnosis not present

## 2018-07-07 DIAGNOSIS — R319 Hematuria, unspecified: Secondary | ICD-10-CM | POA: Diagnosis not present

## 2018-07-07 DIAGNOSIS — M545 Low back pain, unspecified: Secondary | ICD-10-CM

## 2018-07-07 LAB — POC MICROSCOPIC URINALYSIS (UMFC): Mucus: ABSENT

## 2018-07-07 LAB — POCT URINALYSIS DIP (MANUAL ENTRY)
Bilirubin, UA: NEGATIVE
Glucose, UA: NEGATIVE mg/dL
Ketones, POC UA: NEGATIVE mg/dL
Leukocytes, UA: NEGATIVE
Nitrite, UA: NEGATIVE
Protein Ur, POC: NEGATIVE mg/dL
Spec Grav, UA: 1.02 (ref 1.010–1.025)
Urobilinogen, UA: 0.2 E.U./dL
pH, UA: 7 (ref 5.0–8.0)

## 2018-07-07 LAB — POCT URINE PREGNANCY: Preg Test, Ur: NEGATIVE

## 2018-07-07 MED ORDER — TAMSULOSIN HCL 0.4 MG PO CAPS: 0.4000 mg | ORAL_CAPSULE | Freq: Every day | ORAL | 3 refills | Status: DC

## 2018-07-07 MED ORDER — HYDROCODONE-ACETAMINOPHEN 5-325 MG PO TABS: 1.0000 | ORAL_TABLET | Freq: Four times a day (QID) | ORAL | 0 refills | Status: DC | PRN

## 2018-07-07 NOTE — Patient Instructions (Signed)
° ° ° °  If you have lab work done today you will be contacted with your lab results within the next 2 weeks.  If you have not heard from us then please contact us. The fastest way to get your results is to register for My Chart. ° ° °IF you received an x-ray today, you will receive an invoice from [facility]. Please contact [facility] at [phone] with questions or concerns regarding your invoice.  ° °IF you received labwork today, you will receive an invoice from [facility]. Please contact [facility] at [phone] with questions or concerns regarding your invoice.  ° °Our billing staff will not be able to assist you with questions regarding bills from these companies. ° °You will be contacted with the lab results as soon as they are available. The fastest way to get your results is to activate your My Chart account. Instructions are located on the last page of this paperwork. If you have not heard from us regarding the results in 2 weeks, please contact this office. °  ° ° ° °

## 2018-07-07 NOTE — Progress Notes (Signed)
10/9/20192:31 PM  Emily Mccarthy May 30, 1981, 37 y.o. female [id number]  Chief Complaint  Patient presents with  . Back Pain    mid to lower back pain. Using heat pad and motrin for the pain    HPI:   Patient is a 37 y.o. female with past medical history significant for ADHD who presents today for back pain  Back pain that started while she was lying in bed watching TV 4 days ago Pain goes down her spine from bra line down to her low back, constant Progressing, nauseous, hot and cold, very fidgety, cant get comfortable Has been doing heating pad, ibuprofen and foam roller No dysuria, no blood, urgency, frequency No h/o back pain Exercises regularly, eats healthy No inciting event Father has kidney stones  Fall Risk  07/07/2018 07/07/2018 09/02/2017 04/07/2017 03/05/2017  Falls in the past year? No No No No No     Depression screen [facility] 2/9 07/07/2018 07/07/2018 09/02/2017  Decreased Interest 0 0 0  Down, Depressed, Hopeless 0 0 0  PHQ - 2 Score 0 0 0    No Known Allergies  Prior to Admission medications   Medication Sig Start Date End Date Taking? Authorizing Provider  amphetamine-dextroamphetamine (ADDERALL) 30 MG tablet Take 1 tablet by mouth 2 (two) times daily. 03/12/18  Yes Weber, Sarah L, PA-C  amphetamine-dextroamphetamine (ADDERALL) 30 MG tablet Take 1 tablet by mouth 2 (two) times daily. 03/12/18  Yes Weber, Sarah L, PA-C  amphetamine-dextroamphetamine (ADDERALL) 30 MG tablet Take 1 tablet by mouth 2 (two) times daily. 03/12/18  Yes Weber, Sarah L, PA-C  clindamycin (CLEOCIN T) 1 % lotion 1 APPLICATION APPLY ON THE SKIN IN THE MORNING APPLY THIN LAYER TO FACE EVERY MORNING 11/14/15  Yes [provider]  NUVARING 0.12-0.015 MG/24HR vaginal ring  07/14/11  Yes [provider]    History reviewed. No pertinent past medical history.  History reviewed. No pertinent surgical history.  Social History   Tobacco Use  . Smoking status: Former Smoker   Packs/day: 0.50  . Smokeless tobacco: Never Used  Substance Use Topics  . Alcohol use: Yes    Comment: up to 3 glasses wine a week    Family History  Problem Relation Age of Onset  . Heart attack Father   . Sudden death Neg Hx     ROS Per hpi  OBJECTIVE:  Blood pressure 115/77, pulse 85, temperature 98.3 F (36.8 C), temperature source Oral, height 5\' 9"  (1.753 m), weight 172 lb (78 kg), last menstrual period 06/14/2018, SpO2 98 %. Body mass index is 25.4 kg/m.   Physical Exam  Constitutional: She is oriented to person, place, and time. She appears well-developed and well-nourished.  HENT:  Head: Normocephalic and atraumatic.  Mouth/Throat: Oropharynx is clear and moist. No oropharyngeal exudate.  Eyes: Pupils are equal, round, and reactive to light. Conjunctivae and EOM are normal. No scleral icterus.  Neck: Neck supple.  Cardiovascular: Normal rate, regular rhythm and normal heart sounds. Exam reveals no gallop and no friction rub.  No murmur heard. Pulmonary/Chest: Effort normal and breath sounds normal. She has no wheezes. She has no rales.  Abdominal: Soft. Bowel sounds are normal. She exhibits no distension. There is no tenderness.  Musculoskeletal: She exhibits no edema.       Arms: Neurological: She is alert and oriented to person, place, and time.  Skin: Skin is warm and dry.  Psychiatric: She has a normal mood and affect.  Nursing note and vitals  reviewed.    Results for orders placed or performed in visit on 07/07/18 (from the past 24 hour(s))  POCT urinalysis dipstick     Status: Abnormal   Collection Time: 07/07/18  1:50 PM  Result Value Ref Range   Color, UA yellow yellow   Clarity, UA clear clear   Glucose, UA negative negative mg/dL   Bilirubin, UA negative negative   Ketones, POC UA negative negative mg/dL   Spec Grav, UA 9.147 8.295 - 1.025   Blood, UA moderate (A) negative   pH, UA 7.0 5.0 - 8.0   Protein Ur, POC negative negative mg/dL    Urobilinogen, UA 0.2 0.2 or 1.0 E.U./dL   Nitrite, UA Negative Negative   Leukocytes, UA Negative Negative  POCT Microscopic Urinalysis ([facility])     Status: Abnormal   Collection Time: 07/07/18  2:50 PM  Result Value Ref Range   WBC,UR,HPF,POC None None WBC/hpf   RBC,UR,HPF,POC Few (A) None RBC/hpf   Bacteria None None, Too numerous to count   Mucus Absent Absent   Epithelial Cells, UR Per Microscopy None None, Too numerous to count cells/hpf  POCT urine pregnancy     Status: None   Collection Time: 07/07/18  2:59 PM  Result Value Ref Range   Preg Test, Ur Negative Negative    Dg Abd 2 Views  Result Date: 07/07/2018 CLINICAL DATA:  Hematuria.  Back pain. EXAM: ABDOMEN - 2 VIEW COMPARISON:  None. FINDINGS: No radiopaque stones overlie the kidneys or expected locations ureters or bladder. No dilated small bowel loops. Minimal colonic stool. No evidence of pneumatosis or pneumoperitoneum. Clear lung bases. IMPRESSION: No radiopaque urolithiasis.  Nonobstructive bowel gas pattern. Electronically Signed   By: Delbert Phenix M.D.   On: 07/07/2018 15:21    ASSESSMENT and PLAN  1. Acute bilateral thoracic back pain Acute onset, with waves of intensity and hematuria, treating empirically as kidney stones, discussed supportive measures, new med r/se/b. RTC precautions reviewed - POCT urinalysis dipstick - POCT Microscopic Urinalysis ([facility]) - DG Abd 2 Views; Future - POCT urine pregnancy - US Renal; Future  2. Other microscopic hematuria - DG Abd 2 Views; Future  3. Need for prophylactic vaccination and inoculation against influenza - Flu Vaccine QUAD 36+ mos IM  Other orders - tamsulosin (FLOMAX) 0.4 MG CAPS capsule; Take 1 capsule (0.4 mg total) by mouth daily. - HYDROcodone-acetaminophen (NORCO/VICODIN) 5-325 MG tablet; Take 1 tablet by mouth every 6 (six) hours as needed for moderate pain.    Return if symptoms worsen or fail to improve.    Myles Lipps, MD Primary Care at  [facility] [address] [place], [place] [address] Ph.  [phone] Fax [phone]

## 2018-07-20 ENCOUNTER — Encounter: Payer: Self-pay | Admitting: Physician Assistant

## 2018-07-20 ENCOUNTER — Ambulatory Visit (INDEPENDENT_AMBULATORY_CARE_PROVIDER_SITE_OTHER): Payer: BLUE CROSS/BLUE SHIELD | Admitting: Physician Assistant

## 2018-07-20 ENCOUNTER — Other Ambulatory Visit: Payer: Self-pay

## 2018-07-20 DIAGNOSIS — F988 Other specified behavioral and emotional disorders with onset usually occurring in childhood and adolescence: Secondary | ICD-10-CM

## 2018-07-20 MED ORDER — AMPHETAMINE-DEXTROAMPHETAMINE 30 MG PO TABS: 30.0000 mg | ORAL_TABLET | Freq: Two times a day (BID) | ORAL | 0 refills | Status: DC

## 2018-07-20 NOTE — Patient Instructions (Addendum)
Contact me in 3 months when you need more refills. Follow up in office in 6 months. Thank you for letting me participate in your health and well being.    If you have lab work done today you will be contacted with your lab results within the next 2 weeks.  If you have not heard from [place] then please contact us. The fastest way to get your results is to register for My Chart.   IF you received an x-ray today, you will receive an invoice from [facility]. Please contact [facility] at [phone] with questions or concerns regarding your invoice.   IF you received labwork today, you will receive an invoice from [facility]. Please contact [facility] at [phone] with questions or concerns regarding your invoice.   Our billing staff will not be able to assist you with questions regarding bills from these companies.  You will be contacted with the lab results as soon as they are available. The fastest way to get your results is to activate your My Chart account. Instructions are located on the last page of this paperwork. If you have not heard from [place] regarding the results in 2 weeks, please contact this office.

## 2018-07-20 NOTE — Progress Notes (Signed)
Emily Mccarthy  MRN: [id number] DOB: Aug 03, 1981  Subjective:  Emily Mccarthy is a 37 y.o. female seen in office today for a chief complaint of medication refill for ADD meds.  Takes Adderall 30 mg twice daily.  Has been on this dose for years.  Was diagnosed with ADD as a young child.  Takes first dose first thing in the morning and the second 1 right after lunch.  Tolerates this regimen very well.  Denies chest pain, heart palpitations, weight loss, decreased appetite, insomnia, high blood pressure.  No illicit drug use.  Denies any other questions or concerns today.  Review of Systems  Per HPI  Patient Active Problem List   Diagnosis Date Noted  . Wrist pain, acute, left 09/02/2017  . Generalized abdominal pain 09/02/2017  . Neck muscle spasm 03/05/2017  . Neck pain 03/05/2017  . 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 Nicotine addiction 06/13/2012  . Left elbow pain 06/24/2011  . Right wrist injury 06/17/2011    Current Outpatient Medications on File Prior to Visit  Medication Sig Dispense Refill  . amphetamine-dextroamphetamine (ADDERALL) 30 MG tablet Take 1 tablet by mouth 2 (two) times daily. 60 tablet 0  . amphetamine-dextroamphetamine (ADDERALL) 30 MG tablet Take 1 tablet by mouth 2 (two) times daily. 60 tablet 0  . amphetamine-dextroamphetamine (ADDERALL) 30 MG tablet Take 1 tablet by mouth 2 (two) times daily. 60 tablet 0  . clindamycin (CLEOCIN T) 1 % lotion 1 APPLICATION APPLY ON THE SKIN IN THE MORNING APPLY THIN LAYER TO FACE EVERY MORNING  12  . NUVARING 0.12-0.015 MG/24HR vaginal ring      No current facility-administered medications on file prior to visit.     No Known Allergies   Objective:  BP 106/71   Pulse 80   Temp 99 F (37.2 C) (Oral)   Resp 18   Ht 5\' 9"  (1.753 m)   Wt 172 lb 9.6 oz (78.3 kg)   LMP 06/22/2018   SpO2 99%   BMI 25.49 kg/m   Physical Exam  Constitutional: She is oriented to person, place, and time. She appears well-developed  and well-nourished. No distress.  HENT:  Head: Normocephalic and atraumatic.  Eyes: Conjunctivae are normal.  Neck: Normal range of motion.  Cardiovascular: Normal rate, regular rhythm, normal heart sounds and intact distal pulses.  Pulmonary/Chest: Effort normal.  Neurological: She is alert and oriented to person, place, and time.  Skin: Skin is warm and dry.  Psychiatric: She has a normal mood and affect.  Vitals reviewed.    Wt Readings from Last 3 Encounters:  07/20/18 172 lb 9.6 oz (78.3 kg)  07/07/18 172 lb (78 kg)  09/02/17 174 lb (78.9 kg)     Assessment and Plan :  1. Attention deficit disorder (ADD) without hyperactivity Controlled on this dose.  Controlled substance agreement form completed.  [place] Controlled Substance Database Registry was reviewed and is consistent with patient's history. Request refills via mychart in 3 months. F/u in office in 6 months.  - amphetamine-dextroamphetamine (ADDERALL) 30 MG tablet; Take 1 tablet by mouth 2 (two) times daily.  Dispense: 60 tablet; Refill: 0 - amphetamine-dextroamphetamine (ADDERALL) 30 MG tablet; Take 1 tablet by mouth 2 (two) times daily.  Dispense: 60 tablet; Refill: 0 - amphetamine-dextroamphetamine (ADDERALL) 30 MG tablet; Take 1 tablet by mouth 2 (two) times daily.  Dispense: 60 tablet; Refill: 0  Benjiman Core PA-C  Primary Care at [facility] 07/20/2018 10:12  AM

## 2018-07-30 DIAGNOSIS — Z23 Encounter for immunization: Secondary | ICD-10-CM | POA: Diagnosis not present

## 2018-07-30 DIAGNOSIS — M546 Pain in thoracic spine: Secondary | ICD-10-CM | POA: Diagnosis not present

## 2018-08-31 DIAGNOSIS — Z6824 Body mass index (BMI) 24.0-24.9, adult: Secondary | ICD-10-CM | POA: Diagnosis not present

## 2018-08-31 DIAGNOSIS — Z01419 Encounter for gynecological examination (general) (routine) without abnormal findings: Secondary | ICD-10-CM | POA: Diagnosis not present

## 2018-08-31 DIAGNOSIS — Z124 Encounter for screening for malignant neoplasm of cervix: Secondary | ICD-10-CM | POA: Diagnosis not present

## 2018-09-09 DIAGNOSIS — M9902 Segmental and somatic dysfunction of thoracic region: Secondary | ICD-10-CM | POA: Diagnosis not present

## 2018-09-09 DIAGNOSIS — M9908 Segmental and somatic dysfunction of rib cage: Secondary | ICD-10-CM | POA: Diagnosis not present

## 2018-09-09 DIAGNOSIS — G243 Spasmodic torticollis: Secondary | ICD-10-CM | POA: Diagnosis not present

## 2018-09-09 DIAGNOSIS — M9901 Segmental and somatic dysfunction of cervical region: Secondary | ICD-10-CM | POA: Diagnosis not present

## 2018-09-10 DIAGNOSIS — M9902 Segmental and somatic dysfunction of thoracic region: Secondary | ICD-10-CM | POA: Diagnosis not present

## 2018-09-10 DIAGNOSIS — G243 Spasmodic torticollis: Secondary | ICD-10-CM | POA: Diagnosis not present

## 2018-09-10 DIAGNOSIS — M9901 Segmental and somatic dysfunction of cervical region: Secondary | ICD-10-CM | POA: Diagnosis not present

## 2018-09-10 DIAGNOSIS — M9908 Segmental and somatic dysfunction of rib cage: Secondary | ICD-10-CM | POA: Diagnosis not present

## 2018-09-13 DIAGNOSIS — M9901 Segmental and somatic dysfunction of cervical region: Secondary | ICD-10-CM | POA: Diagnosis not present

## 2018-09-13 DIAGNOSIS — G243 Spasmodic torticollis: Secondary | ICD-10-CM | POA: Diagnosis not present

## 2018-09-13 DIAGNOSIS — M9902 Segmental and somatic dysfunction of thoracic region: Secondary | ICD-10-CM | POA: Diagnosis not present

## 2018-09-13 DIAGNOSIS — M9908 Segmental and somatic dysfunction of rib cage: Secondary | ICD-10-CM | POA: Diagnosis not present

## 2018-10-07 DIAGNOSIS — Z6825 Body mass index (BMI) 25.0-25.9, adult: Secondary | ICD-10-CM | POA: Diagnosis not present

## 2018-10-07 DIAGNOSIS — Z3202 Encounter for pregnancy test, result negative: Secondary | ICD-10-CM | POA: Diagnosis not present

## 2018-10-07 DIAGNOSIS — R87619 Unspecified abnormal cytological findings in specimens from cervix uteri: Secondary | ICD-10-CM | POA: Diagnosis not present

## 2018-10-26 ENCOUNTER — Telehealth: Payer: Self-pay | Admitting: General Practice

## 2018-10-26 DIAGNOSIS — F988 Other specified behavioral and emotional disorders with onset usually occurring in childhood and adolescence: Secondary | ICD-10-CM

## 2018-10-26 NOTE — Telephone Encounter (Signed)
Copied from CRM [id number]. Topic: Quick Communication - Rx Refill/Question >> Oct 26, 2018  9:23 AM Herby Abraham C wrote: Medication: amphetamine-dextroamphetamine (ADDERALL) 30 MG tablet   Has the patient contacted their pharmacy? Yes   (Agent: If no, request that the patient contact the pharmacy for the refill.) (Agent: If yes, when and what did the pharmacy advise?)  Preferred Pharmacy (with phone number or street name): [facility] [place], [place] - [address]  Agent: Please be advised that RX refills may take up to 3 business days. We ask that you follow-up with your pharmacy.

## 2018-10-29 MED ORDER — AMPHETAMINE-DEXTROAMPHETAMINE 30 MG PO TABS: 30.0000 mg | ORAL_TABLET | Freq: Two times a day (BID) | ORAL | 0 refills | Status: DC

## 2018-10-29 NOTE — Telephone Encounter (Signed)
Please schedule patient to establish care as patient's previous pcp in longer with this practice. She will need to be seen in about 6 weeks. thanks

## 2018-10-29 NOTE — Telephone Encounter (Signed)
Chart and pmp reviewed Refilled med for next 2 months Needs appt to establish with new PCP

## 2018-10-29 NOTE — Telephone Encounter (Signed)
Please refill if possible

## 2018-11-04 ENCOUNTER — Telehealth: Payer: Self-pay | Admitting: Family Medicine

## 2018-11-04 DIAGNOSIS — F988 Other specified behavioral and emotional disorders with onset usually occurring in childhood and adolescence: Secondary | ICD-10-CM

## 2018-11-04 NOTE — Telephone Encounter (Signed)
Patient is wanting 1 more month of her medication Adderall sent over since she states she has always gotten 3 months. Dr. Leretha Pol sent over 2 months instead because she needs to establish with another provider. I scheduled her with Dr. Leretha Pol on 12/31/2018. Patient states that if she can not get that third month of medication then to send her a MyChart message and she will reschedule her appt for an earlier date. Patient states that she has to pay a lot out of pocket because of her new insurance so she is trying to wait 3 months.

## 2018-11-10 MED ORDER — AMPHETAMINE-DEXTROAMPHETAMINE 30 MG PO TABS: 30.0000 mg | ORAL_TABLET | Freq: Two times a day (BID) | ORAL | 0 refills | Status: DC

## 2018-11-10 NOTE — Telephone Encounter (Signed)
Pmop reviewed Medication refilled for 30 days She needs to keep appt in April thanks

## 2018-11-10 NOTE — Addendum Note (Signed)
Addended by: Myles Lipps on: 11/10/2018 08:15 AM   Modules accepted: Orders

## 2018-11-17 ENCOUNTER — Telehealth: Payer: Self-pay | Admitting: Family Medicine

## 2018-11-17 NOTE — Telephone Encounter (Signed)
LVM for pt regarding appt scheduled with Dr. Leretha Pol on 4/3. Due to [place] being out of the office during that time, pt will need to be rescheduled. When pt calls back, please reschedule appt from that day at their convenience. Thank you!

## 2018-12-10 ENCOUNTER — Telehealth: Payer: Self-pay | Admitting: Family Medicine

## 2018-12-10 DIAGNOSIS — F988 Other specified behavioral and emotional disorders with onset usually occurring in childhood and adolescence: Secondary | ICD-10-CM

## 2018-12-10 NOTE — Telephone Encounter (Signed)
Copied from CRM [id number]. Topic: Quick Communication - Rx Refill/Question >> Dec 10, 2018 11:14 AM Jens Som A wrote: Medication: amphetamine-dextroamphetamine (ADDERALL) 30 MG tablet [[id number]] -only two scripts were sent the last time. Normally 3 scripts are sent.   Has the patient contacted their pharmacy? Yes  (Agent: If no, request that the patient contact the pharmacy for the refill.) (Agent: If yes, when and what did the pharmacy advise?)  Preferred Pharmacy (with phone number or street name): [facility] [place], [place] - [address] [phone] (Phone) [phone] (Fax)    Agent: Please be advised that RX refills may take up to 3 business days. We ask that you follow-up with your pharmacy.

## 2018-12-13 NOTE — Telephone Encounter (Signed)
Please advise 

## 2018-12-14 MED ORDER — AMPHETAMINE-DEXTROAMPHETAMINE 30 MG PO TABS: 30.0000 mg | ORAL_TABLET | Freq: Two times a day (BID) | ORAL | 0 refills | Status: DC

## 2018-12-14 NOTE — Telephone Encounter (Signed)
pmp reviewed  Med refilled Has appt with me April 30th

## 2018-12-14 NOTE — Addendum Note (Signed)
Addended by: Myles Lipps on: 12/14/2018 02:08 PM   Modules accepted: Orders

## 2018-12-14 NOTE — Telephone Encounter (Signed)
Please let patient know that I have sent refill for march 17 and April 17 She needs to keep appt of April 30th for any further refills thanks

## 2018-12-15 NOTE — Telephone Encounter (Signed)
Notified pt via phone refill snet for 3/17 and 4/17 and she will need to keep her appt for 4/30 for further refills.  Pt agreeable. Dgaddy, CMA

## 2018-12-31 ENCOUNTER — Ambulatory Visit: Payer: BLUE CROSS/BLUE SHIELD | Admitting: Family Medicine

## 2019-01-13 DIAGNOSIS — Z9889 Other specified postprocedural states: Secondary | ICD-10-CM | POA: Diagnosis not present

## 2019-01-13 DIAGNOSIS — H43393 Other vitreous opacities, bilateral: Secondary | ICD-10-CM | POA: Diagnosis not present

## 2019-01-27 ENCOUNTER — Telehealth (INDEPENDENT_AMBULATORY_CARE_PROVIDER_SITE_OTHER): Payer: BLUE CROSS/BLUE SHIELD | Admitting: Family Medicine

## 2019-01-27 ENCOUNTER — Other Ambulatory Visit: Payer: Self-pay

## 2019-01-27 ENCOUNTER — Telehealth: Payer: Self-pay | Admitting: Family Medicine

## 2019-01-27 DIAGNOSIS — F5104 Psychophysiologic insomnia: Secondary | ICD-10-CM

## 2019-01-27 DIAGNOSIS — F988 Other specified behavioral and emotional disorders with onset usually occurring in childhood and adolescence: Secondary | ICD-10-CM

## 2019-01-27 MED ORDER — AMPHETAMINE-DEXTROAMPHETAMINE 30 MG PO TABS: 30.0000 mg | ORAL_TABLET | Freq: Two times a day (BID) | ORAL | 0 refills | Status: DC

## 2019-01-27 MED ORDER — HYDROXYZINE HCL 25 MG PO TABS: 25.0000 mg | ORAL_TABLET | Freq: Every evening | ORAL | 2 refills | Status: DC | PRN

## 2019-01-27 MED ORDER — TRAZODONE HCL 50 MG PO TABS: 50.0000 mg | ORAL_TABLET | Freq: Every evening | ORAL | 2 refills | Status: DC | PRN

## 2019-01-27 NOTE — Progress Notes (Signed)
Only refill on the adderall 30mg 

## 2019-01-27 NOTE — Telephone Encounter (Addendum)
01/27/2019 - PATIENT HAD A TELEMED VISIT WITH DR. Darcel Bayley ON Thursday (01/27/2019). DR. Darcel Bayley REQUESTED PATIENT RETURN IN 6 MONTHS (OCT) FOR A FOLLOW-UP APPOINTMENT. I TRIED TO CALL AND SCHEDULE BUT HAD TO LEAVE A VOICE MAIL TO RETURN OUR CALL. MBC

## 2019-01-27 NOTE — Progress Notes (Signed)
Virtual Visit Note  I connected with patient on 01/27/19 at 148pm by phone and verified that I am speaking with the correct person using two identifiers. Emily Mccarthy is currently located at home and patient is currently with them during visit. The provider, Myles Lipps, MD is located in their office at time of visit.  I discussed the limitations, risks, security and privacy concerns of performing an evaluation and management service by telephone and the availability of in person appointments. I also discussed with the patient that there may be a patient responsible charge related to this service. The patient expressed understanding and agreed to proceed.   CC: refill of adderrall  HPI ? 38 yo F with PMH of ADD who requests refill of adderral  Last OV [facility] in Oct 2019 Dr Claiborne Billings for [facility]  Takes adderral 30mg  BID Diagnosed in college Has been current regime for many years Sleeping well until recently, normal appettite, no palpitations pmp reviewed  Wt Readings from Last 3 Encounters:  07/20/18 172 lb 9.6 oz (78.3 kg)  07/07/18 172 lb (78 kg)  09/02/17 174 lb (78.9 kg)   BP Readings from Last 3 Encounters:  07/20/18 106/71  07/07/18 115/77  09/02/17 122/72    Recent insomnia related to covid 19 stressors Works in [employer] which has neg impacted her income Has been trying melatonin gummies x 1 month Each gummy has 3 mg, has been taking 2 gummies, not working Following good sleep hygiene practices  No Known Allergies  Prior to Admission medications   Medication Sig Start Date End Date Taking? Authorizing Provider  amphetamine-dextroamphetamine (ADDERALL) 30 MG tablet Take 1 tablet by mouth 2 (two) times daily. 07/20/18   Benjiman Core D, PA-C  amphetamine-dextroamphetamine (ADDERALL) 30 MG tablet Take 1 tablet by mouth 2 (two) times daily. 12/14/18   Myles Lipps, MD  amphetamine-dextroamphetamine (ADDERALL) 30 MG tablet Take 1 tablet by mouth 2 (two)  times daily. 12/14/18   Myles Lipps, MD  clindamycin (CLEOCIN T) 1 % lotion 1 APPLICATION APPLY ON THE SKIN IN THE MORNING APPLY THIN LAYER TO FACE EVERY MORNING 11/14/15   [provider]  Lavella Lemons 0.12-0.015 MG/24HR vaginal ring  07/14/11   [provider]    No past medical history on file.  No past surgical history on file.  Social History   Tobacco Use  . Smoking status: Former Smoker    Packs/day: 0.50  . Smokeless tobacco: Never Used  Substance Use Topics  . Alcohol use: Yes    Comment: up to 3 glasses wine a week    Family History  Problem Relation Age of Onset  . Heart attack Father   . Sudden death Neg Hx     ROS Per hpi  Objective  Vitals as reported by the patient: none   ASSESSMENT and PLAN  1. Attention deficit disorder (ADD) without hyperactivity Controlled. Continue current regime.  - amphetamine-dextroamphetamine (ADDERALL) 30 MG tablet; Take 1 tablet by mouth 2 (two) times daily. - amphetamine-dextroamphetamine (ADDERALL) 30 MG tablet; Take 1 tablet by mouth 2 (two) times daily. - amphetamine-dextroamphetamine (ADDERALL) 30 MG tablet; Take 1 tablet by mouth 2 (two) times daily.  2. Psychophysiological insomnia New issue. Due to current stressors. Discussed treatment options, new meds r/se/b. Cont with sleep hygiene.   Other orders - hydrOXYzine (ATARAX/VISTARIL) 25 MG tablet; Take 1-2 tablets (25-50 mg total) by mouth at bedtime as needed. - traZODone (DESYREL) 50 MG tablet; Take 1-2 tablets (50-100  mg total) by mouth at bedtime as needed for sleep.  FOLLOW-UP: 6 months, will call in 3 months for refills   The above assessment and management plan was discussed with the patient. The patient verbalized understanding of and has agreed to the management plan. Patient is aware to call the clinic if symptoms persist or worsen. Patient is aware when to return to the clinic for a follow-up visit. Patient educated on when it is  appropriate to go to the emergency department.    I provided 21 minutes of non-face-to-face time during this encounter.  Myles LippsIrma M Santiago, MD Primary Care at [facility] [address] [place], [place] [id number] Ph.  [phone] Fax [phone]

## 2019-04-14 ENCOUNTER — Encounter: Payer: Self-pay | Admitting: Family Medicine

## 2019-04-14 ENCOUNTER — Other Ambulatory Visit: Payer: Self-pay

## 2019-04-14 DIAGNOSIS — F988 Other specified behavioral and emotional disorders with onset usually occurring in childhood and adolescence: Secondary | ICD-10-CM

## 2019-04-14 MED ORDER — AMPHETAMINE-DEXTROAMPHETAMINE 30 MG PO TABS: 30.0000 mg | ORAL_TABLET | Freq: Two times a day (BID) | ORAL | 0 refills | Status: DC

## 2019-04-14 NOTE — Telephone Encounter (Signed)
Pt requested early refill of Adderall d/t [place].  Spoke to Dr. Pamella Pert, early refill ok'ed. Call to pharmacy.  Due for refill tomorrow, ok to fill today.  L/m for pt to advise.

## 2019-04-14 NOTE — Telephone Encounter (Signed)
Requested Prescriptions   Pending Prescriptions Disp Refills  . amphetamine-dextroamphetamine (ADDERALL) 30 MG tablet 60 tablet 0    Sig: Take 1 tablet by mouth 2 (two) times daily.    Last OV 01/27/2019  Last written 01/27/2019

## 2019-05-10 ENCOUNTER — Other Ambulatory Visit: Payer: Self-pay | Admitting: Family Medicine

## 2019-08-05 DIAGNOSIS — D1801 Hemangioma of skin and subcutaneous tissue: Secondary | ICD-10-CM | POA: Diagnosis not present

## 2019-08-05 DIAGNOSIS — D2261 Melanocytic nevi of right upper limb, including shoulder: Secondary | ICD-10-CM | POA: Diagnosis not present

## 2019-08-05 DIAGNOSIS — D2262 Melanocytic nevi of left upper limb, including shoulder: Secondary | ICD-10-CM | POA: Diagnosis not present

## 2019-08-05 DIAGNOSIS — I788 Other diseases of capillaries: Secondary | ICD-10-CM | POA: Diagnosis not present

## 2019-08-08 ENCOUNTER — Other Ambulatory Visit: Payer: Self-pay | Admitting: Family Medicine

## 2019-08-08 DIAGNOSIS — F988 Other specified behavioral and emotional disorders with onset usually occurring in childhood and adolescence: Secondary | ICD-10-CM

## 2019-08-09 ENCOUNTER — Telehealth: Payer: Self-pay | Admitting: *Deleted

## 2019-08-09 MED ORDER — AMPHETAMINE-DEXTROAMPHETAMINE 30 MG PO TABS: 30.0000 mg | ORAL_TABLET | Freq: Two times a day (BID) | ORAL | 0 refills | Status: DC

## 2019-08-09 NOTE — Telephone Encounter (Signed)
Called to Schedule appt for future med refills, no answer Left voicemail

## 2019-08-09 NOTE — Telephone Encounter (Signed)
pmp reviewd, appropriate meds refilled for 30 days, no refills Needs OV

## 2019-08-17 DIAGNOSIS — Z6823 Body mass index (BMI) 23.0-23.9, adult: Secondary | ICD-10-CM | POA: Diagnosis not present

## 2019-08-17 DIAGNOSIS — Z20828 Contact with and (suspected) exposure to other viral communicable diseases: Secondary | ICD-10-CM | POA: Diagnosis not present

## 2019-08-22 ENCOUNTER — Encounter: Payer: Self-pay | Admitting: Family Medicine

## 2019-09-01 NOTE — Telephone Encounter (Signed)
LVMTCB and schedule vv. Also advised patient to schedule via mychart and request a vv and we will convert

## 2019-09-08 DIAGNOSIS — B977 Papillomavirus as the cause of diseases classified elsewhere: Secondary | ICD-10-CM | POA: Diagnosis not present

## 2019-09-08 DIAGNOSIS — Z01419 Encounter for gynecological examination (general) (routine) without abnormal findings: Secondary | ICD-10-CM | POA: Diagnosis not present

## 2019-09-08 DIAGNOSIS — Z6822 Body mass index (BMI) 22.0-22.9, adult: Secondary | ICD-10-CM | POA: Diagnosis not present

## 2019-09-08 LAB — HM PAP SMEAR: HM Pap smear: POSITIVE

## 2019-09-26 DIAGNOSIS — Z20828 Contact with and (suspected) exposure to other viral communicable diseases: Secondary | ICD-10-CM | POA: Diagnosis not present

## 2019-09-29 DIAGNOSIS — Z20828 Contact with and (suspected) exposure to other viral communicable diseases: Secondary | ICD-10-CM | POA: Diagnosis not present

## 2019-09-30 NOTE — L&D Delivery Note (Signed)
Patient was C/C/+1 and pushed for 20 minutes withOUT epidural.    NSVD  female infant, Apgars 7,9, weight p.   The patient had a second degree R labial laceration that extended to the vagina- vagina was repaired with 2-0 vicryl R and the labia with 3-0 vicryl R.  Cath of bladder after for about 60 cc urine.. Fundus was firm. EBL was more than expected amount- about 500 total and bleeding was stopped with TXA, cytotec and high dose pitocin. Placenta was delivered intact. Vagina was clear.  Delayed cord clamping done for 30-60 seconds while warming baby. Baby was vigorous and doing skin to skin with mother.  BP 170/79 pp, labetalol given.   Emily Mccarthy

## 2019-10-06 DIAGNOSIS — Z20828 Contact with and (suspected) exposure to other viral communicable diseases: Secondary | ICD-10-CM | POA: Diagnosis not present

## 2019-11-22 DIAGNOSIS — Z3201 Encounter for pregnancy test, result positive: Secondary | ICD-10-CM | POA: Diagnosis not present

## 2019-11-22 DIAGNOSIS — Z3490 Encounter for supervision of normal pregnancy, unspecified, unspecified trimester: Secondary | ICD-10-CM | POA: Diagnosis not present

## 2019-11-22 DIAGNOSIS — Z6824 Body mass index (BMI) 24.0-24.9, adult: Secondary | ICD-10-CM | POA: Diagnosis not present

## 2019-11-22 DIAGNOSIS — Z113 Encounter for screening for infections with a predominantly sexual mode of transmission: Secondary | ICD-10-CM | POA: Diagnosis not present

## 2019-11-22 DIAGNOSIS — N925 Other specified irregular menstruation: Secondary | ICD-10-CM | POA: Diagnosis not present

## 2019-12-12 DIAGNOSIS — O09511 Supervision of elderly primigravida, first trimester: Secondary | ICD-10-CM | POA: Diagnosis not present

## 2019-12-12 DIAGNOSIS — Z3480 Encounter for supervision of other normal pregnancy, unspecified trimester: Secondary | ICD-10-CM | POA: Diagnosis not present

## 2019-12-12 LAB — OB RESULTS CONSOLE RPR: RPR: NONREACTIVE

## 2019-12-12 LAB — OB RESULTS CONSOLE RUBELLA ANTIBODY, IGM: Rubella: IMMUNE

## 2019-12-12 LAB — OB RESULTS CONSOLE HEPATITIS B SURFACE ANTIGEN: Hepatitis B Surface Ag: NEGATIVE

## 2019-12-12 LAB — OB RESULTS CONSOLE HIV ANTIBODY (ROUTINE TESTING): HIV: NONREACTIVE

## 2019-12-20 DIAGNOSIS — Z369 Encounter for antenatal screening, unspecified: Secondary | ICD-10-CM | POA: Diagnosis not present

## 2020-01-18 DIAGNOSIS — Z3482 Encounter for supervision of other normal pregnancy, second trimester: Secondary | ICD-10-CM | POA: Diagnosis not present

## 2020-01-18 DIAGNOSIS — Z369 Encounter for antenatal screening, unspecified: Secondary | ICD-10-CM | POA: Diagnosis not present

## 2020-02-16 DIAGNOSIS — O09522 Supervision of elderly multigravida, second trimester: Secondary | ICD-10-CM | POA: Diagnosis not present

## 2020-02-16 DIAGNOSIS — Z363 Encounter for antenatal screening for malformations: Secondary | ICD-10-CM | POA: Diagnosis not present

## 2020-03-01 DIAGNOSIS — Z369 Encounter for antenatal screening, unspecified: Secondary | ICD-10-CM | POA: Diagnosis not present

## 2020-03-07 DIAGNOSIS — H43393 Other vitreous opacities, bilateral: Secondary | ICD-10-CM | POA: Diagnosis not present

## 2020-03-28 DIAGNOSIS — Z3480 Encounter for supervision of other normal pregnancy, unspecified trimester: Secondary | ICD-10-CM | POA: Diagnosis not present

## 2020-03-28 DIAGNOSIS — Z369 Encounter for antenatal screening, unspecified: Secondary | ICD-10-CM | POA: Diagnosis not present

## 2020-03-28 LAB — OB RESULTS CONSOLE RPR: RPR: NONREACTIVE

## 2020-04-23 DIAGNOSIS — Z23 Encounter for immunization: Secondary | ICD-10-CM | POA: Diagnosis not present

## 2020-05-09 DIAGNOSIS — Z369 Encounter for antenatal screening, unspecified: Secondary | ICD-10-CM | POA: Diagnosis not present

## 2020-05-17 DIAGNOSIS — Z369 Encounter for antenatal screening, unspecified: Secondary | ICD-10-CM | POA: Diagnosis not present

## 2020-05-18 ENCOUNTER — Other Ambulatory Visit: Payer: Self-pay

## 2020-05-24 DIAGNOSIS — Z369 Encounter for antenatal screening, unspecified: Secondary | ICD-10-CM | POA: Diagnosis not present

## 2020-06-06 DIAGNOSIS — Z6830 Body mass index (BMI) 30.0-30.9, adult: Secondary | ICD-10-CM | POA: Diagnosis not present

## 2020-06-06 DIAGNOSIS — O09523 Supervision of elderly multigravida, third trimester: Secondary | ICD-10-CM | POA: Diagnosis not present

## 2020-06-06 DIAGNOSIS — Z369 Encounter for antenatal screening, unspecified: Secondary | ICD-10-CM | POA: Diagnosis not present

## 2020-06-06 DIAGNOSIS — Z3480 Encounter for supervision of other normal pregnancy, unspecified trimester: Secondary | ICD-10-CM | POA: Diagnosis not present

## 2020-06-06 LAB — OB RESULTS CONSOLE GBS
GBS: NEGATIVE
GBS: POSITIVE

## 2020-06-07 ENCOUNTER — Inpatient Hospital Stay (HOSPITAL_COMMUNITY)
Admission: AD | Admit: 2020-06-07 | Discharge: 2020-06-07 | Disposition: A | Discharge status: Home / Self Care | Payer: BC Managed Care – PPO | Attending: Obstetrics and Gynecology | Admitting: Obstetrics and Gynecology

## 2020-06-07 ENCOUNTER — Encounter (HOSPITAL_COMMUNITY): Payer: Self-pay | Admitting: Obstetrics and Gynecology

## 2020-06-07 ENCOUNTER — Other Ambulatory Visit: Payer: Self-pay

## 2020-06-07 DIAGNOSIS — Z3A36 36 weeks gestation of pregnancy: Secondary | ICD-10-CM | POA: Diagnosis not present

## 2020-06-07 DIAGNOSIS — D649 Anemia, unspecified: Secondary | ICD-10-CM | POA: Diagnosis not present

## 2020-06-07 DIAGNOSIS — Z87891 Personal history of nicotine dependence: Secondary | ICD-10-CM | POA: Insufficient documentation

## 2020-06-07 DIAGNOSIS — R030 Elevated blood-pressure reading, without diagnosis of hypertension: Secondary | ICD-10-CM | POA: Diagnosis not present

## 2020-06-07 DIAGNOSIS — Z8249 Family history of ischemic heart disease and other diseases of the circulatory system: Secondary | ICD-10-CM | POA: Diagnosis not present

## 2020-06-07 DIAGNOSIS — O1213 Gestational proteinuria, third trimester: Secondary | ICD-10-CM | POA: Diagnosis not present

## 2020-06-07 DIAGNOSIS — O99013 Anemia complicating pregnancy, third trimester: Secondary | ICD-10-CM | POA: Diagnosis not present

## 2020-06-07 DIAGNOSIS — Z3689 Encounter for other specified antenatal screening: Secondary | ICD-10-CM

## 2020-06-07 DIAGNOSIS — Z3480 Encounter for supervision of other normal pregnancy, unspecified trimester: Secondary | ICD-10-CM | POA: Diagnosis not present

## 2020-06-07 DIAGNOSIS — O09513 Supervision of elderly primigravida, third trimester: Secondary | ICD-10-CM | POA: Diagnosis not present

## 2020-06-07 LAB — CBC
HCT: 29.3 % — ABNORMAL LOW (ref 36.0–46.0)
Hemoglobin: 9 g/dL — ABNORMAL LOW (ref 12.0–15.0)
MCH: 25.6 pg — ABNORMAL LOW (ref 26.0–34.0)
MCHC: 30.7 g/dL (ref 30.0–36.0)
MCV: 83.5 fL (ref 80.0–100.0)
Platelets: 371 10*3/uL (ref 150–400)
RBC: 3.51 MIL/uL — ABNORMAL LOW (ref 3.87–5.11)
RDW: 13.2 % (ref 11.5–15.5)
WBC: 8.2 10*3/uL (ref 4.0–10.5)
nRBC: 0 % (ref 0.0–0.2)

## 2020-06-07 LAB — COMPREHENSIVE METABOLIC PANEL
ALT: 17 U/L (ref 0–44)
AST: 27 U/L (ref 15–41)
Albumin: 2.8 g/dL — ABNORMAL LOW (ref 3.5–5.0)
Alkaline Phosphatase: 73 U/L (ref 38–126)
Anion gap: 10 (ref 5–15)
BUN: 10 mg/dL (ref 6–20)
CO2: 19 mmol/L — ABNORMAL LOW (ref 22–32)
Calcium: 8.9 mg/dL (ref 8.9–10.3)
Chloride: 106 mmol/L (ref 98–111)
Creatinine, Ser: 0.88 mg/dL (ref 0.44–1.00)
GFR calc Af Amer: >60 mL/min (ref >60)
GFR calc non Af Amer: >60 mL/min (ref >60)
Glucose, Bld: 79 mg/dL (ref 70–99)
Potassium: 4.3 mmol/L (ref 3.5–5.1)
Sodium: 135 mmol/L (ref 135–145)
Total Bilirubin: 0.6 mg/dL (ref 0.3–1.2)
Total Protein: 6.2 g/dL — ABNORMAL LOW (ref 6.5–8.1)

## 2020-06-07 LAB — URINALYSIS, MICROSCOPIC (REFLEX)

## 2020-06-07 LAB — URINALYSIS, ROUTINE W REFLEX MICROSCOPIC
Bilirubin Urine: NEGATIVE
Glucose, UA: NEGATIVE mg/dL
Ketones, ur: NEGATIVE mg/dL
Nitrite: NEGATIVE
Protein, ur: 30 mg/dL — AB
Specific Gravity, Urine: 1.025 (ref 1.005–1.030)
pH: 6.5 (ref 5.0–8.0)

## 2020-06-07 LAB — PROTEIN / CREATININE RATIO, URINE
Creatinine, Urine: 185.86 mg/dL
Protein Creatinine Ratio: 0.26 mg/mg{Cre} — ABNORMAL HIGH (ref 0.00–0.15)
Total Protein, Urine: 48 mg/dL

## 2020-06-07 MED ORDER — FERROUS SULFATE 325 (65 FE) MG PO TABS: 325.0000 mg | ORAL_TABLET | Freq: Every day | ORAL | 3 refills | Status: DC

## 2020-06-07 NOTE — Discharge Instructions (Signed)
Preeclampsia and Eclampsia °Preeclampsia is a serious condition that may develop during pregnancy. This condition causes high blood pressure and increased protein in your urine along with other symptoms, such as headaches and vision changes. These symptoms may develop as the condition gets worse. Preeclampsia may occur at 20 weeks of pregnancy or later. °Diagnosing and treating preeclampsia early is very important. If not treated early, it can cause serious problems for you and your baby. One problem it can lead to is eclampsia. Eclampsia is a condition that causes muscle jerking or shaking (convulsions or seizures) and other serious problems for the mother. During pregnancy, delivering your baby may be the best treatment for preeclampsia or eclampsia. For most women, preeclampsia and eclampsia symptoms go away after giving birth. °In rare cases, a woman may develop preeclampsia after giving birth (postpartum preeclampsia). This usually occurs within 48 hours after childbirth but may occur up to 6 weeks after giving birth. °What are the causes? °The cause of preeclampsia is not known. °What increases the risk? °The following risk factors make you more likely to develop preeclampsia: °· Being pregnant for the first time. °· Having had preeclampsia during a past pregnancy. °· Having a family history of preeclampsia. °· Having high blood pressure. °· Being pregnant with more than one baby. °· Being 35 or older. °· Being African-American. °· Having kidney disease or diabetes. °· Having medical conditions such as lupus or blood diseases. °· Being very overweight (obese). °What are the signs or symptoms? °The most common symptoms are: °· Severe headaches. °· Vision problems, such as blurred or double vision. °· Abdominal pain, especially upper abdominal pain. °Other symptoms that may develop as the condition gets worse include: °· Sudden weight gain. °· Sudden swelling of the hands, face, legs, and feet. °· Severe nausea  and vomiting. °· Numbness in the face, arms, legs, and feet. °· Dizziness. °· Urinating less than usual. °· Slurred speech. °· Convulsions or seizures. °How is this diagnosed? °There are no screening tests for preeclampsia. Your health care provider will ask you about symptoms and check for signs of preeclampsia during your prenatal visits. You may also have tests that include: °· Checking your blood pressure. °· Urine tests to check for protein. Your health care provider will check for this at every prenatal visit. °· Blood tests. °· Monitoring your baby's heart rate. °· Ultrasound. °How is this treated? °You and your health care provider will determine the treatment approach that is best for you. Treatment may include: °· Having more frequent prenatal exams to check for signs of preeclampsia, if you have an increased risk for preeclampsia. °· Medicine to lower your blood pressure. °· Staying in the hospital, if your condition is severe. There, treatment will focus on controlling your blood pressure and the amount of fluids in your body (fluid retention). °· Taking medicine (magnesium sulfate) to prevent seizures. This may be given as an injection or through an IV. °· Taking a low-dose aspirin during your pregnancy. °· Delivering your baby early. You may have your labor started with medicine (induced), or you may have a cesarean delivery. °Follow these instructions at home: °Eating and drinking ° °· Drink enough fluid to keep your urine pale yellow. °· Avoid caffeine. °Lifestyle °· Do not use any products that contain nicotine or tobacco, such as cigarettes and e-cigarettes. If you need help quitting, ask your health care provider. °· Do not use alcohol or drugs. °· Avoid stress as much as possible. Rest and get   plenty of sleep. General instructions  Take over-the-counter and prescription medicines only as told by your health care provider.  When lying down, lie on your left side. This keeps pressure off your  major blood vessels.  When sitting or lying down, raise (elevate) your feet. Try putting some pillows underneath your lower legs.  Exercise regularly. Ask your health care provider what kinds of exercise are best for you.  Keep all follow-up and prenatal visits as told by your health care provider. This is important. How is this prevented? There is no known way of preventing preeclampsia or eclampsia from developing. However, to lower your risk of complications and detect problems early:  Get regular prenatal care. Your health care provider may be able to diagnose and treat the condition early.  Maintain a healthy weight. Ask your health care provider for help managing weight gain during pregnancy.  Work with your health care provider to manage any long-term (chronic) health conditions you have, such as diabetes or kidney problems.  You may have tests of your blood pressure and kidney function after giving birth.  Your health care provider may have you take low-dose aspirin during your next pregnancy. Contact a health care provider if:  You have symptoms that your health care provider told you may require more treatment or monitoring, such as: ? Headaches. ? Nausea or vomiting. ? Abdominal pain. ? Dizziness. ? Light-headedness. Get help right away if:  You have severe: ? Abdominal pain. ? Headaches that do not get better. ? Dizziness. ? Vision problems. ? Confusion. ? Nausea or vomiting.  You have any of the following: ? A seizure. ? Sudden, rapid weight gain. ? Sudden swelling in your hands, ankles, or face. ? Trouble moving any part of your body. ? Numbness in any part of your body. ? Trouble speaking. ? Abnormal bleeding.  You faint. Summary  Preeclampsia is a serious condition that may develop during pregnancy.  This condition causes high blood pressure and increased protein in your urine along with other symptoms, such as headaches and vision  changes.  Diagnosing and treating preeclampsia early is very important. If not treated early, it can cause serious problems for you and your baby.  Get help right away if you have symptoms that your health care provider told you to watch for. This information is not intended to replace advice given to you by your health care provider. Make sure you discuss any questions you have with your health care provider. Document Revised: 05/18/2018 Document Reviewed: 04/21/2016 Elsevier Patient Education  2020 [employer]        Hypertension During Pregnancy High blood pressure (hypertension) is when the force of blood pumping through the arteries is too strong. Arteries are blood vessels that carry blood from the heart throughout the body. Hypertension during pregnancy can be mild or severe. Severe hypertension during pregnancy (preeclampsia) is a medical emergency that requires prompt evaluation and treatment. Different types of hypertension can happen during pregnancy. These include:  Chronic hypertension. This happens when you had high blood pressure before you became pregnant, and it continues during the pregnancy. Hypertension that develops before you are [redacted] weeks pregnant and continues during the pregnancy is also called chronic hypertension. If you have chronic hypertension, it will not go away after you have your baby. You will need follow-up visits with your health care provider after you have your baby. Your doctor may want you to keep taking medicine for your blood pressure.  Gestational hypertension. This  is hypertension that develops after the 20th week of pregnancy. Gestational hypertension usually goes away after you have your baby, but your health care provider will need to monitor your blood pressure to make sure that it is getting better.  Preeclampsia. This is severe hypertension during pregnancy. This can cause serious complications for you and your baby and can also cause  complications for you after the delivery of your baby.  Postpartum preeclampsia. You may develop severe hypertension after giving birth. This usually occurs within 48 hours after childbirth but may occur up to 6 weeks after giving birth. This is rare. How does this affect me? Women who have hypertension during pregnancy have a greater chance of developing hypertension later in life or during future pregnancies. In some cases, hypertension during pregnancy can cause serious complications, such as:  Stroke.  Heart attack.  Injury to other organs, such as kidneys, lungs, or liver.  Preeclampsia.  Convulsions or seizures.  Placental abruption. How does this affect my baby? Hypertension during pregnancy can affect your baby. Your baby may:  Be born early (prematurely).  Not weigh as much as he or she should at birth (low birth weight).  Not tolerate labor well, leading to an unplanned cesarean delivery. What are the risks? There are certain factors that make it more likely for you to develop hypertension during pregnancy. These include:  Having hypertension during a previous pregnancy.  Being overweight.  Being age 71 or older.  Being pregnant for the first time.  Being pregnant with more than one baby.  Becoming pregnant using fertilization methods, such as IVF (in vitro fertilization).  Having other medical problems, such as diabetes, kidney disease, or lupus.  Having a family history of hypertension. What can I do to lower my risk? The exact cause of hypertension during pregnancy is not known. You may be able to lower your risk by:  Maintaining a healthy weight.  Eating a healthy and balanced diet.  Following your health care provider's instructions about treating any long-term conditions that you had before becoming pregnant. It is very important to keep all of your prenatal care appointments. Your health care provider will check your blood pressure and make sure  that your pregnancy is progressing as expected. If a problem is found, early treatment can prevent complications. How is this treated? Treatment for hypertension during pregnancy varies depending on the type of hypertension you have and how serious it is.  If you were taking medicine for high blood pressure before you became pregnant, talk with your health care provider. You may need to change medicine during pregnancy because some medicines, like ACE inhibitors, may not be considered safe for your baby.  If you have gestational hypertension, your health care provider may order medicine to treat this during pregnancy.  If you are at risk for preeclampsia, your health care provider may recommend that you take a low-dose aspirin during your pregnancy.  If you have severe hypertension, you may need to be hospitalized so you and your baby can be monitored closely. You may also need to be given medicine to lower your blood pressure. This medicine may be given by mouth or through an IV.  In some cases, if your condition gets worse, you may need to deliver your baby early. Follow these instructions at home: Eating and drinking   Drink enough fluid to keep your urine pale yellow.  Avoid caffeine. Lifestyle  Do not use any products that contain nicotine or tobacco, such as  cigarettes, e-cigarettes, and chewing tobacco. If you need help quitting, ask your health care provider.  Do not use alcohol or drugs.  Avoid stress as much as possible.  Rest and get plenty of sleep.  Regular exercise can help to reduce your blood pressure. Ask your health care provider what kinds of exercise are best for you. General instructions  Take over-the-counter and prescription medicines only as told by your health care provider.  Keep all prenatal and follow-up visits as told by your health care provider. This is important. Contact a health care provider if:  You have symptoms that your health care provider  told you may require more treatment or monitoring, such as: ? Headaches. ? Nausea or vomiting. ? Abdominal pain. ? Dizziness. ? Light-headedness. Get help right away if:  You have: ? Severe abdominal pain that does not get better with treatment. ? A severe headache that does not get better. ? Vomiting that does not get better. ? Sudden, rapid weight gain. ? Sudden swelling in your hands, ankles, or face. ? Vaginal bleeding. ? Blood in your urine. ? Blurred or double vision. ? Shortness of breath or chest pain. ? Weakness on one side of your body. ? Difficulty speaking.  Your baby is not moving as much as usual. Summary  High blood pressure (hypertension) is when the force of blood pumping through the arteries is too strong.  Hypertension during pregnancy can cause problems for you and your baby.  Treatment for hypertension during pregnancy varies depending on the type of hypertension you have and how serious it is.  Keep all prenatal and follow-up visits as told by your health care provider. This is important. This information is not intended to replace advice given to you by your health care provider. Make sure you discuss any questions you have with your health care provider. Document Revised: 01/06/2019 Document Reviewed: 10/12/2018 Elsevier Patient Education  2020 [employer] Inc.        Pregnancy and Anemia  Anemia is a condition in which the concentration of red blood cells, or hemoglobin, in the blood is below normal. Hemoglobin is a substance in red blood cells that carries oxygen to the tissues of the body. Anemia results when enough oxygen does not reach these tissues. Anemia is common during pregnancy because the woman's body needs more blood volume and blood cells to provide nutrition to the fetus. The fetus needs iron and folic acid as it is developing. Your body may not produce enough red blood cells because of this. Also, during pregnancy, the liquid part of  the blood (plasma) increases by about 30-50%, and the red blood cells increase by only 20%. This lowers the concentration of the red blood cells and creates a natural anemia-like situation. What are the causes? The most common cause of anemia during pregnancy is not having enough iron in the body to make red blood cells (iron deficiency anemia). Other causes may include:  Folic acid deficiency.  Vitamin B12 deficiency.  Certain prescription or over-the-counter medicines.  Certain medical conditions or infections that destroy red blood cells.  A low platelet count and bleeding caused by antibodies that go through the placenta to the fetus from the mothers blood. What are the signs or symptoms? Mild anemia may not be noticeable. If it becomes severe, symptoms may include:  Feeling tired (fatigue).  Shortness of breath, especially during activity.  Weakness.  Fainting.  Pale looking skin.  Headaches.  A fast or irregular heartbeat (palpitations).  Dizziness. How is this diagnosed? This condition may be diagnosed based on:  Your medical history and a physical exam.  Blood tests. How is this treated? Treatment for anemia during pregnancy depends on the cause of the anemia. Treatment can include:  Dietary changes.  Supplements of iron, vitamin B12, or folic acid.  A blood transfusion. This may be needed if anemia is severe.  Hospitalization. This may be needed if there is a lot of blood loss or severe anemia. Follow these instructions at home:  Follow recommendations from your dietitian or health care provider about changing your diet.  Increase your vitamin C intake. This will help the stomach absorb more iron. Some foods that are high in vitamin C include: ? Oranges. ? Peppers. ? Tomatoes. ? Mangoes.  Eat a diet rich in iron. This would include foods such as: ? Liver. ? Beef. ? Eggs. ? Whole grains. ? Spinach. ? Dried fruit.  Take iron and vitamins as  told by your health care provider.  Eat green leafy vegetables. These are a good source of folic acid.  Keep all follow-up visits as told by your health care provider. This is important. Contact a health care provider if:  You have frequent or lasting headaches.  You look pale.  You bruise easily. Get help right away if:  You have extreme weakness, shortness of breath, or chest pain.  You become dizzy or have trouble concentrating.  You have heavy vaginal bleeding.  You develop a rash.  You have bloody or black, tarry stools.  You faint.  You vomit up blood.  You vomit repeatedly.  You have abdominal pain.  You have a fever.  You are dehydrated. Summary  Anemia is a condition in which the concentration of red blood cells or hemoglobin in the blood is below normal.  Anemia is common during pregnancy because the woman's body needs more blood volume and blood cells to provide nutrition to the fetus.  The most common cause of anemia during pregnancy is not having enough iron in the body to make red blood cells (iron deficiency anemia).  Mild anemia may not be noticeable. If it becomes severe, symptoms may include feeling tired and weak. This information is not intended to replace advice given to you by your health care provider. Make sure you discuss any questions you have with your health care provider. Document Revised: 03/01/2019 Document Reviewed: 10/21/2016 Elsevier Patient Education  2020 [employer].        Signs and Symptoms of Labor Labor is your body's natural process of moving your baby, placenta, and umbilical cord out of your uterus. The process of labor usually starts when your baby is full-term, between 68 and 40 weeks of pregnancy. How will I know when I am close to going into labor? As your body prepares for labor and the birth of your baby, you may notice the following symptoms in the weeks and days before true labor starts:  Having a strong  desire to get your home ready to receive your new baby. This is called nesting. Nesting may be a sign that labor is approaching, and it may occur several weeks before birth. Nesting may involve cleaning and organizing your home.  Passing a small amount of thick, bloody mucus out of your vagina (normal bloody show or losing your mucus plug). This may happen more than a week before labor begins, or it might occur right before labor begins as the opening of the cervix starts to  widen (dilate). For some women, the entire mucus plug passes at once. For others, smaller portions of the mucus plug may gradually pass over several days.  Your baby moving (dropping) lower in your pelvis to get into position for birth (lightening). When this happens, you may feel more pressure on your bladder and pelvic bone and less pressure on your ribs. This may make it easier to breathe. It may also cause you to need to urinate more often and have problems with bowel movements.  Having "practice contractions" (Braxton Hicks contractions) that occur at irregular (unevenly spaced) intervals that are more than 10 minutes apart. This is also called false labor. False labor contractions are common after exercise or sexual activity, and they will stop if you change position, rest, or drink fluids. These contractions are usually mild and do not get stronger over time. They may feel like: ? A backache or back pain. ? Mild cramps, similar to menstrual cramps. ? Tightening or pressure in your abdomen. Other early symptoms that labor may be starting soon include:  Nausea or loss of appetite.  Diarrhea.  Having a sudden burst of energy, or feeling very tired.  Mood changes.  Having trouble sleeping. How will I know when labor has begun? Signs that true labor has begun may include:  Having contractions that come at regular (evenly spaced) intervals and increase in intensity. This may feel like more intense tightening or pressure  in your abdomen that moves to your back. ? Contractions may also feel like rhythmic pain in your upper thighs or back that comes and goes at regular intervals. ? For first-time mothers, this change in intensity of contractions often occurs at a more gradual pace. ? Women who have given birth before may notice a more rapid progression of contraction changes.  Having a feeling of pressure in the vaginal area.  Your water breaking (rupture of membranes). This is when the sac of fluid that surrounds your baby breaks. When this happens, you will notice fluid leaking from your vagina. This may be clear or blood-tinged. Labor usually starts within 24 hours of your water breaking, but it may take longer to begin. ? Some women notice this as a gush of fluid. ? Others notice that their underwear repeatedly becomes damp. Follow these instructions at home:   When labor starts, or if your water breaks, call your health care provider or nurse care line. Based on your situation, they will determine when you should go in for an exam.  When you are in early labor, you may be able to rest and manage symptoms at home. Some strategies to try at home include: ? Breathing and relaxation techniques. ? Taking a warm bath or shower. ? Listening to music. ? Using a heating pad on the lower back for pain. If you are directed to use heat:  Place a towel between your skin and the heat source.  Leave the heat on for 20-30 minutes.  Remove the heat if your skin turns bright red. This is especially important if you are unable to feel pain, heat, or cold. You may have a greater risk of getting burned. Get help right away if:  You have painful, regular contractions that are 5 minutes apart or less.  Labor starts before you are [redacted] weeks along in your pregnancy.  You have a fever.  You have a headache that does not go away.  You have bright red blood coming from your vagina.  You do not  feel your baby  moving.  You have a sudden onset of: ? Severe headache with vision problems. ? Nausea, vomiting, or diarrhea. ? Chest pain or shortness of breath. These symptoms may be an emergency. If your health care provider recommends that you go to the hospital or birth center where you plan to deliver, do not drive yourself. Have someone else drive you, or call emergency services (911 in the [place] Summary  Labor is your body's natural process of moving your baby, placenta, and umbilical cord out of your uterus.  The process of labor usually starts when your baby is full-term, between 41 and 40 weeks of pregnancy.  When labor starts, or if your water breaks, call your health care provider or nurse care line. Based on your situation, they will determine when you should go in for an exam. This information is not intended to replace advice given to you by your health care provider. Make sure you discuss any questions you have with your health care provider. Document Revised: 06/15/2017 Document Reviewed: 02/20/2017 Elsevier Patient Education  2020 [employer].

## 2020-06-07 NOTE — MAU Note (Signed)
Sent from MD office for BP evaluation.  Denies H/A, epigastric pain , and visual disturbances.  Endorses +FM.  Denies VB or LOF.

## 2020-06-07 NOTE — MAU Provider Note (Signed)
History     CSN: [id number]  Arrival date and time: 06/07/20 2202   First Provider Initiated Contact with Patient 06/07/20 1038      Chief Complaint  Patient presents with  . BP Evaluation   Ms. Emily Mccarthy is a 39 y.o. G1P0 at [redacted]w[redacted]d who presents to MAU for preeclampsia evaluation after she had elevated pressures in her office yesterday and again on recheck today. Patient denies hx of cHTN and states she has not had elevated pressure prior in this pregnancy. Patient reports she was told by her office if her labs are normal today to take her blood pressure over the weekend and if normal, will have one additional visit on Monday with an induction scheduled for 37 weeks.  Pt denies HA, blurry vision/seeing spots, N/V, epigastric pain, swelling in face and hands, sudden weight gain. Pt denies chest pain and SOB.  Pt denies constipation, diarrhea, or urinary problems. Pt denies fever, chills, fatigue, sweating or changes in appetite. Pt denies dizziness, light-headedness, weakness.  Pt denies VB, ctx, LOF and reports good FM.   OB History    Gravida  1   Para      Term      Preterm      AB      Living        SAB      TAB      Ectopic      Multiple      Live Births              Past Medical History:  Diagnosis Date  . Medical history non-contributory     Past Surgical History:  Procedure Laterality Date  . NO PAST SURGERIES      Family History  Problem Relation Age of Onset  . Heart attack Father   . Sudden death Neg Hx     Social History   Tobacco Use  . Smoking status: Former Smoker    Packs/day: 0.50  . Smokeless tobacco: Never Used  Vaping Use  . Vaping Use: Never used  Substance Use Topics  . Alcohol use: Not Currently    Comment: up to 3 glasses wine a week  . Drug use: No    Allergies: No Known Allergies  Medications Prior to Admission  Medication Sig Dispense Refill Last Dose  . Prenatal Vit-Fe Fumarate-FA  (MULTIVITAMIN-PRENATAL) 27-0.8 MG TABS tablet Take 1 tablet by mouth daily at 12 noon.   06/07/2020 at Unknown time    Review of Systems  Constitutional: Negative for chills, diaphoresis, fatigue and fever.  Eyes: Negative for visual disturbance.  Respiratory: Negative for shortness of breath.   Cardiovascular: Negative for chest pain.  Gastrointestinal: Negative for abdominal pain, constipation, diarrhea, nausea and vomiting.  Genitourinary: Negative for dysuria, flank pain, frequency, pelvic pain, urgency, vaginal bleeding and vaginal discharge.  Neurological: Negative for dizziness, weakness, light-headedness and headaches.   Physical Exam   Blood pressure 133/73, pulse 87, temperature 98.3 F (36.8 C), temperature source Oral, resp. rate 16, height 5\' 10"  (1.778 m), weight 98.2 kg, SpO2 99 %.  Patient Vitals for the past 24 hrs:  BP Temp Temp src Pulse Resp SpO2 Height Weight  06/07/20 1243 133/73 -- -- 87 16 99 % -- --  06/07/20 1101 123/78 -- -- 74 -- -- -- --  06/07/20 1031 125/79 -- -- 74 -- -- -- --  06/07/20 1002 130/71 -- -- 78 -- -- -- --  06/07/20 0936 137/78 98.3  F (36.8 C) Oral 78 20 100 % -- --  06/07/20 0933 -- -- -- -- -- -- 5\' 10"  (1.778 m) 98.2 kg   Physical Exam Vitals and nursing note reviewed.  Constitutional:      General: She is not in acute distress.    Appearance: Normal appearance. She is normal weight. She is not ill-appearing, toxic-appearing or diaphoretic.  HENT:     Head: Normocephalic and atraumatic.  Pulmonary:     Effort: Pulmonary effort is normal.  Neurological:     Mental Status: She is alert and oriented to person, place, and time.  Psychiatric:        Mood and Affect: Mood normal.        Behavior: Behavior normal.        Thought Content: Thought content normal.        Judgment: Judgment normal.    Results for orders placed or performed during the hospital encounter of 06/07/20 (from the past 24 hour(s))  Urinalysis, Routine w  reflex microscopic Urine, Clean Catch     Status: Abnormal   Collection Time: 06/07/20  9:31 AM  Result Value Ref Range   Color, Urine YELLOW YELLOW   APPearance CLOUDY (A) CLEAR   Specific Gravity, Urine 1.025 1.005 - 1.030   pH 6.5 5.0 - 8.0   Glucose, UA NEGATIVE NEGATIVE mg/dL   Hgb urine dipstick SMALL (A) NEGATIVE   Bilirubin Urine NEGATIVE NEGATIVE   Ketones, ur NEGATIVE NEGATIVE mg/dL   Protein, ur 30 (A) NEGATIVE mg/dL   Nitrite NEGATIVE NEGATIVE   Leukocytes,Ua TRACE (A) NEGATIVE  Urinalysis, Microscopic (reflex)     Status: Abnormal   Collection Time: 06/07/20  9:31 AM  Result Value Ref Range   RBC / HPF 0-5 0 - 5 RBC/hpf   WBC, UA 0-5 0 - 5 WBC/hpf   Bacteria, UA FEW (A) NONE SEEN   Squamous Epithelial / LPF 6-10 0 - 5   Urine-Other LESS THAN 10 mL OF URINE SUBMITTED   Protein / creatinine ratio, urine     Status: Abnormal   Collection Time: 06/07/20  9:42 AM  Result Value Ref Range   Creatinine, Urine 185.86 mg/dL   Total Protein, Urine 48 mg/dL   Protein Creatinine Ratio 0.26 (H) 0.00 - 0.15 mg/mg[Cre]  CBC     Status: Abnormal   Collection Time: 06/07/20  9:53 AM  Result Value Ref Range   WBC 8.2 4.0 - 10.5 K/uL   RBC 3.51 (L) 3.87 - 5.11 MIL/uL   Hemoglobin 9.0 (L) 12.0 - 15.0 g/dL   HCT 08/07/20 (L) 36 - 46 %   MCV 83.5 80.0 - 100.0 fL   MCH 25.6 (L) 26.0 - 34.0 pg   MCHC 30.7 30.0 - 36.0 g/dL   RDW 50.2 77.4 - 12.8 %   Platelets 371 150 - 400 K/uL   nRBC 0.0 0.0 - 0.2 %  Comprehensive metabolic panel     Status: Abnormal   Collection Time: 06/07/20  9:53 AM  Result Value Ref Range   Sodium 135 135 - 145 mmol/L   Potassium 4.3 3.5 - 5.1 mmol/L   Chloride 106 98 - 111 mmol/L   CO2 19 (L) 22 - 32 mmol/L   Glucose, Bld 79 70 - 99 mg/dL   BUN 10 6 - 20 mg/dL   Creatinine, Ser 08/07/20 0.44 - 1.00 mg/dL   Calcium 8.9 8.9 - 7.67 mg/dL   Total Protein 6.2 (L) 6.5 - 8.1 g/dL  Albumin 2.8 (L) 3.5 - 5.0 g/dL   AST 27 15 - 41 U/L   ALT 17 0 - 44 U/L   Alkaline  Phosphatase 73 38 - 126 U/L   Total Bilirubin 0.6 0.3 - 1.2 mg/dL   GFR calc non Af Amer >60 >60 mL/min   GFR calc Af Amer >60 >60 mL/min   Anion gap 10 5 - 15   No results found.  MAU Course  Procedures  MDM -preeclampsia evaluation without severe range BP in MAU on admission -elevated BP of none, pressures normal in MAU -symptoms include: none -UA: cloudy/sm hgb/30PRO/trace leuks/few bacteria, sending urine for culture -CBC: H/H 9.0/29.3, platelets 371 -CMP: serum creatinine 0.88, AST/ALT 27/17 -PCr: 0.26 (had hgb in UA) -EFM: reactive       -baseline: 130/140       -variability: moderate       -accels: present, 15x15       -decels: absent       -TOCO: irritability -called and notified Dr. Henderson CloudHorvath of results, states patient already has f/u appointment on Monday and she is comfortable maintaining that plan -pt discharged to home in stable condition  Orders Placed This Encounter  Procedures  . Culture, OB Urine    Standing Status:   Standing    Number of Occurrences:   1  . Urinalysis, Routine w reflex microscopic Urine, Clean Catch    Standing Status:   Standing    Number of Occurrences:   1  . CBC    Standing Status:   Standing    Number of Occurrences:   1  . Comprehensive metabolic panel    Standing Status:   Standing    Number of Occurrences:   1  . Protein / creatinine ratio, urine    Standing Status:   Standing    Number of Occurrences:   1  . Urinalysis, Microscopic (reflex)    Standing Status:   Standing    Number of Occurrences:   1  . Discharge patient    Order Specific Question:   Discharge disposition    Answer:   01-Home or Self Care [1]    Order Specific Question:   Discharge patient date    Answer:   06/07/2020    Assessment and Plan   1. Proteinuria affecting pregnancy in third trimester   2. [redacted] weeks gestation of pregnancy   3. NST (non-stress test) reactive   4. Anemia during pregnancy in third trimester     Allergies as of 06/07/2020    No Known Allergies     Medication List    TAKE these medications   ferrous sulfate 325 (65 FE) MG tablet Take 1 tablet (325 mg total) by mouth daily.   multivitamin-prenatal 27-0.8 MG Tabs tablet Take 1 tablet by mouth daily at 12 noon.       -will call with culture results, if positive -RX iron -Reviewed warning blood pressure values (systolic = / > 140 and/or diastolic =/> 90). Explained that, if blood pressure is elevated, she should sit down, rest, and eat/drink something. If still elevated 15 minutes later, and she is greater than 20 weeks, she should call clinic or come to MAU. She should come to MAU if she has elevated pressures and any of the following:  - headache not relieved with tylenol, rest, hydration -blurry vision, floating spots in her vision - sudden full-body edema or facial edema -RUQ pain that is constant. These symptoms may indicate that her  blood pressure is worsening and she may be developing gestational hypertension or pre-eclampsia, which is an emergency.  -return MAU precautions given -pt discharged to home in stable condition  Emily Mccarthy 06/07/2020, 12:48 PM

## 2020-06-08 LAB — CULTURE, OB URINE: Culture: NO GROWTH

## 2020-06-11 ENCOUNTER — Telehealth (HOSPITAL_COMMUNITY): Payer: Self-pay | Admitting: *Deleted

## 2020-06-11 ENCOUNTER — Encounter (HOSPITAL_COMMUNITY): Payer: Self-pay | Admitting: *Deleted

## 2020-06-11 DIAGNOSIS — O139 Gestational [pregnancy-induced] hypertension without significant proteinuria, unspecified trimester: Secondary | ICD-10-CM

## 2020-06-11 DIAGNOSIS — O09523 Supervision of elderly multigravida, third trimester: Secondary | ICD-10-CM | POA: Diagnosis not present

## 2020-06-11 HISTORY — DX: Gestational (pregnancy-induced) hypertension without significant proteinuria, unspecified trimester: O13.9

## 2020-06-11 NOTE — Telephone Encounter (Signed)
Preadmission screen  

## 2020-06-12 ENCOUNTER — Other Ambulatory Visit (HOSPITAL_COMMUNITY)
Admission: RE | Admit: 2020-06-12 | Discharge: 2020-06-12 | Disposition: A | Discharge status: Home / Self Care | Payer: BC Managed Care – PPO | Source: Ambulatory Visit | Attending: Obstetrics and Gynecology | Admitting: Obstetrics and Gynecology

## 2020-06-12 DIAGNOSIS — O99824 Streptococcus B carrier state complicating childbirth: Secondary | ICD-10-CM | POA: Diagnosis not present

## 2020-06-12 DIAGNOSIS — O9902 Anemia complicating childbirth: Secondary | ICD-10-CM | POA: Diagnosis not present

## 2020-06-12 DIAGNOSIS — Z87891 Personal history of nicotine dependence: Secondary | ICD-10-CM | POA: Diagnosis not present

## 2020-06-12 DIAGNOSIS — H532 Diplopia: Secondary | ICD-10-CM | POA: Diagnosis not present

## 2020-06-12 DIAGNOSIS — Z3A37 37 weeks gestation of pregnancy: Secondary | ICD-10-CM | POA: Diagnosis not present

## 2020-06-12 DIAGNOSIS — O134 Gestational [pregnancy-induced] hypertension without significant proteinuria, complicating childbirth: Secondary | ICD-10-CM | POA: Diagnosis not present

## 2020-06-12 DIAGNOSIS — Z2882 Immunization not carried out because of caregiver refusal: Secondary | ICD-10-CM | POA: Diagnosis not present

## 2020-06-12 DIAGNOSIS — O1414 Severe pre-eclampsia complicating childbirth: Secondary | ICD-10-CM | POA: Diagnosis not present

## 2020-06-12 DIAGNOSIS — Z01812 Encounter for preprocedural laboratory examination: Secondary | ICD-10-CM | POA: Insufficient documentation

## 2020-06-12 DIAGNOSIS — O09513 Supervision of elderly primigravida, third trimester: Secondary | ICD-10-CM | POA: Diagnosis not present

## 2020-06-12 DIAGNOSIS — Z23 Encounter for immunization: Secondary | ICD-10-CM | POA: Diagnosis not present

## 2020-06-12 DIAGNOSIS — H538 Other visual disturbances: Secondary | ICD-10-CM | POA: Diagnosis not present

## 2020-06-12 DIAGNOSIS — Z20822 Contact with and (suspected) exposure to covid-19: Secondary | ICD-10-CM | POA: Diagnosis not present

## 2020-06-12 LAB — SARS CORONAVIRUS 2 (TAT 6-24 HRS): SARS Coronavirus 2: NEGATIVE

## 2020-06-13 ENCOUNTER — Inpatient Hospital Stay (HOSPITAL_COMMUNITY): Payer: BC Managed Care – PPO

## 2020-06-14 ENCOUNTER — Inpatient Hospital Stay (HOSPITAL_COMMUNITY)
Admission: AD | Admit: 2020-06-14 | Discharge: 2020-06-16 | DRG: 807 | Disposition: A | Discharge status: Home / Self Care | Payer: BC Managed Care – PPO | Attending: Obstetrics and Gynecology | Admitting: Obstetrics and Gynecology

## 2020-06-14 ENCOUNTER — Encounter (HOSPITAL_COMMUNITY): Payer: Self-pay | Admitting: Anesthesiology

## 2020-06-14 ENCOUNTER — Other Ambulatory Visit: Payer: Self-pay

## 2020-06-14 ENCOUNTER — Encounter (HOSPITAL_COMMUNITY): Payer: Self-pay | Admitting: Obstetrics & Gynecology

## 2020-06-14 DIAGNOSIS — Z23 Encounter for immunization: Secondary | ICD-10-CM

## 2020-06-14 DIAGNOSIS — O1414 Severe pre-eclampsia complicating childbirth: Principal | ICD-10-CM | POA: Diagnosis present

## 2020-06-14 DIAGNOSIS — Z3A37 37 weeks gestation of pregnancy: Secondary | ICD-10-CM | POA: Diagnosis not present

## 2020-06-14 DIAGNOSIS — Z20822 Contact with and (suspected) exposure to covid-19: Secondary | ICD-10-CM | POA: Diagnosis present

## 2020-06-14 DIAGNOSIS — O134 Gestational [pregnancy-induced] hypertension without significant proteinuria, complicating childbirth: Secondary | ICD-10-CM | POA: Diagnosis present

## 2020-06-14 DIAGNOSIS — O133 Gestational [pregnancy-induced] hypertension without significant proteinuria, third trimester: Secondary | ICD-10-CM

## 2020-06-14 DIAGNOSIS — O99824 Streptococcus B carrier state complicating childbirth: Secondary | ICD-10-CM | POA: Diagnosis present

## 2020-06-14 DIAGNOSIS — O9902 Anemia complicating childbirth: Secondary | ICD-10-CM | POA: Diagnosis present

## 2020-06-14 DIAGNOSIS — Z87891 Personal history of nicotine dependence: Secondary | ICD-10-CM | POA: Diagnosis not present

## 2020-06-14 DIAGNOSIS — O139 Gestational [pregnancy-induced] hypertension without significant proteinuria, unspecified trimester: Secondary | ICD-10-CM

## 2020-06-14 LAB — COMPREHENSIVE METABOLIC PANEL
ALT: 12 U/L (ref 0–44)
AST: 24 U/L (ref 15–41)
Albumin: 2.6 g/dL — ABNORMAL LOW (ref 3.5–5.0)
Alkaline Phosphatase: 77 U/L (ref 38–126)
Anion gap: 11 (ref 5–15)
BUN: 14 mg/dL (ref 6–20)
CO2: 18 mmol/L — ABNORMAL LOW (ref 22–32)
Calcium: 9.2 mg/dL (ref 8.9–10.3)
Chloride: 105 mmol/L (ref 98–111)
Creatinine, Ser: 0.92 mg/dL (ref 0.44–1.00)
GFR calc Af Amer: >60 mL/min (ref >60)
GFR calc non Af Amer: >60 mL/min (ref >60)
Glucose, Bld: 86 mg/dL (ref 70–99)
Potassium: 4.1 mmol/L (ref 3.5–5.1)
Sodium: 134 mmol/L — ABNORMAL LOW (ref 135–145)
Total Bilirubin: 0.7 mg/dL (ref 0.3–1.2)
Total Protein: 5.6 g/dL — ABNORMAL LOW (ref 6.5–8.1)

## 2020-06-14 LAB — CBC
HCT: 27 % — ABNORMAL LOW (ref 36.0–46.0)
Hemoglobin: 8.6 g/dL — ABNORMAL LOW (ref 12.0–15.0)
MCH: 26.1 pg (ref 26.0–34.0)
MCHC: 31.9 g/dL (ref 30.0–36.0)
MCV: 82.1 fL (ref 80.0–100.0)
Platelets: 317 10*3/uL (ref 150–400)
RBC: 3.29 MIL/uL — ABNORMAL LOW (ref 3.87–5.11)
RDW: 13.2 % (ref 11.5–15.5)
WBC: 9 10*3/uL (ref 4.0–10.5)
nRBC: 0 % (ref 0.0–0.2)

## 2020-06-14 LAB — TYPE AND SCREEN
ABO/RH(D): O POS
Antibody Screen: NEGATIVE

## 2020-06-14 LAB — PROTEIN / CREATININE RATIO, URINE
Creatinine, Urine: 138.48 mg/dL
Protein Creatinine Ratio: 1.01 mg/mg{Cre} — ABNORMAL HIGH (ref 0.00–0.15)
Total Protein, Urine: 140 mg/dL

## 2020-06-14 LAB — RPR: RPR Ser Ql: NONREACTIVE

## 2020-06-14 MED ORDER — ONDANSETRON HCL 4 MG/2ML IJ SOLN: 4.0000 mg | INTRAMUSCULAR | Status: DC | PRN

## 2020-06-14 MED ORDER — FENTANYL CITRATE (PF) 100 MCG/2ML IJ SOLN
50.0000 ug | INTRAMUSCULAR | Status: DC | PRN
  Administered 2020-06-14: 100 ug via INTRAVENOUS
  Filled 2020-06-14: qty 2

## 2020-06-14 MED ORDER — ZOLPIDEM TARTRATE 5 MG PO TABS: 5.0000 mg | ORAL_TABLET | Freq: Every evening | ORAL | Status: DC | PRN

## 2020-06-14 MED ORDER — OXYTOCIN-SODIUM CHLORIDE 30-0.9 UT/500ML-% IV SOLN
2.5000 [IU]/h | INTRAVENOUS | Status: DC
  Administered 2020-06-14: 2.5 [IU]/h via INTRAVENOUS
  Filled 2020-06-14: qty 500

## 2020-06-14 MED ORDER — OXYTOCIN-SODIUM CHLORIDE 30-0.9 UT/500ML-% IV SOLN
1.0000 m[IU]/min | INTRAVENOUS | Status: DC
  Filled 2020-06-14: qty 500

## 2020-06-14 MED ORDER — SODIUM CHLORIDE 0.9 % IV SOLN: 250.0000 mL | INTRAVENOUS | Status: DC | PRN

## 2020-06-14 MED ORDER — LABETALOL HCL 5 MG/ML IV SOLN: 40.0000 mg | INTRAVENOUS | Status: DC | PRN

## 2020-06-14 MED ORDER — METHYLERGONOVINE MALEATE 0.2 MG/ML IJ SOLN: 0.2000 mg | INTRAMUSCULAR | Status: DC | PRN

## 2020-06-14 MED ORDER — LACTATED RINGERS IV SOLN: 500.0000 mL | Freq: Once | INTRAVENOUS | Status: DC

## 2020-06-14 MED ORDER — TERBUTALINE SULFATE 1 MG/ML IJ SOLN: 0.2500 mg | Freq: Once | INTRAMUSCULAR | Status: DC | PRN

## 2020-06-14 MED ORDER — PENICILLIN G POT IN DEXTROSE 60000 UNIT/ML IV SOLN
3.0000 10*6.[IU] | INTRAVENOUS | Status: DC
  Administered 2020-06-14 (×2): 3 10*6.[IU] via INTRAVENOUS
  Filled 2020-06-14 (×2): qty 50

## 2020-06-14 MED ORDER — LIDOCAINE HCL (PF) 1 % IJ SOLN: 30.0000 mL | Freq: Once | INTRAMUSCULAR | Status: AC

## 2020-06-14 MED ORDER — COCONUT OIL OIL
1.0000 "application " | TOPICAL_OIL | Status: DC | PRN
  Administered 2020-06-15: 1 via TOPICAL

## 2020-06-14 MED ORDER — ONDANSETRON HCL 4 MG/2ML IJ SOLN
4.0000 mg | Freq: Four times a day (QID) | INTRAMUSCULAR | Status: DC | PRN
  Administered 2020-06-14: 4 mg via INTRAVENOUS
  Filled 2020-06-14: qty 2

## 2020-06-14 MED ORDER — SODIUM CHLORIDE 0.9% FLUSH: 3.0000 mL | INTRAVENOUS | Status: DC | PRN

## 2020-06-14 MED ORDER — TRANEXAMIC ACID-NACL 1000-0.7 MG/100ML-% IV SOLN
INTRAVENOUS | Status: AC
  Administered 2020-06-14: 1000 mg via INTRAVENOUS
  Filled 2020-06-14: qty 100

## 2020-06-14 MED ORDER — LABETALOL HCL 5 MG/ML IV SOLN
20.0000 mg | INTRAVENOUS | Status: DC | PRN
  Administered 2020-06-14: 20 mg via INTRAVENOUS
  Filled 2020-06-14: qty 4

## 2020-06-14 MED ORDER — PHENYLEPHRINE 40 MCG/ML (10ML) SYRINGE FOR IV PUSH (FOR BLOOD PRESSURE SUPPORT)
80.0000 ug | PREFILLED_SYRINGE | INTRAVENOUS | Status: DC | PRN
  Filled 2020-06-14: qty 10

## 2020-06-14 MED ORDER — ACETAMINOPHEN 325 MG PO TABS: 650.0000 mg | ORAL_TABLET | ORAL | Status: DC | PRN

## 2020-06-14 MED ORDER — MISOPROSTOL 200 MCG PO TABS: 800.0000 ug | ORAL_TABLET | Freq: Once | ORAL | Status: AC

## 2020-06-14 MED ORDER — MISOPROSTOL 200 MCG PO TABS
ORAL_TABLET | ORAL | Status: AC
  Administered 2020-06-14: 800 ug via RECTAL
  Filled 2020-06-14: qty 4

## 2020-06-14 MED ORDER — OXYTOCIN-SODIUM CHLORIDE 30-0.9 UT/500ML-% IV SOLN
1.0000 m[IU]/min | INTRAVENOUS | Status: DC
  Administered 2020-06-14: 2 m[IU]/min via INTRAVENOUS

## 2020-06-14 MED ORDER — DIPHENHYDRAMINE HCL 50 MG/ML IJ SOLN: 12.5000 mg | INTRAMUSCULAR | Status: DC | PRN

## 2020-06-14 MED ORDER — WITCH HAZEL-GLYCERIN EX PADS
1.0000 "application " | MEDICATED_PAD | CUTANEOUS | Status: DC | PRN
  Administered 2020-06-15: 1 via TOPICAL

## 2020-06-14 MED ORDER — METHYLERGONOVINE MALEATE 0.2 MG PO TABS: 0.2000 mg | ORAL_TABLET | ORAL | Status: DC | PRN

## 2020-06-14 MED ORDER — FENTANYL-BUPIVACAINE-NACL 0.5-0.125-0.9 MG/250ML-% EP SOLN
12.0000 mL/h | EPIDURAL | Status: DC | PRN
  Filled 2020-06-14: qty 250

## 2020-06-14 MED ORDER — DIPHENHYDRAMINE HCL 25 MG PO CAPS: 25.0000 mg | ORAL_CAPSULE | Freq: Four times a day (QID) | ORAL | Status: DC | PRN

## 2020-06-14 MED ORDER — LIDOCAINE HCL (PF) 1 % IJ SOLN
30.0000 mL | INTRAMUSCULAR | Status: AC | PRN
  Administered 2020-06-14: 30 mL via SUBCUTANEOUS

## 2020-06-14 MED ORDER — SENNOSIDES-DOCUSATE SODIUM 8.6-50 MG PO TABS
2.0000 | ORAL_TABLET | ORAL | Status: DC
  Administered 2020-06-15 (×2): 2 via ORAL
  Filled 2020-06-14 (×2): qty 2

## 2020-06-14 MED ORDER — LACTATED RINGERS IV SOLN: INTRAVENOUS | Status: DC

## 2020-06-14 MED ORDER — PRENATAL MULTIVITAMIN CH
1.0000 | ORAL_TABLET | Freq: Every day | ORAL | Status: DC
  Administered 2020-06-15 – 2020-06-16 (×2): 1 via ORAL
  Filled 2020-06-14 (×2): qty 1

## 2020-06-14 MED ORDER — BENZOCAINE-MENTHOL 20-0.5 % EX AERO: 1.0000 "application " | INHALATION_SPRAY | CUTANEOUS | Status: DC | PRN

## 2020-06-14 MED ORDER — HYDRALAZINE HCL 20 MG/ML IJ SOLN: 10.0000 mg | INTRAMUSCULAR | Status: DC | PRN

## 2020-06-14 MED ORDER — MISOPROSTOL 25 MCG QUARTER TABLET
25.0000 ug | ORAL_TABLET | ORAL | Status: DC | PRN
  Administered 2020-06-14: 25 ug via VAGINAL
  Filled 2020-06-14 (×2): qty 1

## 2020-06-14 MED ORDER — TRANEXAMIC ACID-NACL 1000-0.7 MG/100ML-% IV SOLN: 1000.0000 mg | INTRAVENOUS | Status: AC

## 2020-06-14 MED ORDER — SODIUM CHLORIDE 0.9 % IV SOLN
5.0000 10*6.[IU] | Freq: Once | INTRAVENOUS | Status: AC
  Administered 2020-06-14: 5 10*6.[IU] via INTRAVENOUS
  Filled 2020-06-14: qty 5

## 2020-06-14 MED ORDER — MAGNESIUM HYDROXIDE 400 MG/5ML PO SUSP: 30.0000 mL | ORAL | Status: DC | PRN

## 2020-06-14 MED ORDER — LABETALOL HCL 5 MG/ML IV SOLN: 80.0000 mg | INTRAVENOUS | Status: DC | PRN

## 2020-06-14 MED ORDER — EPHEDRINE 5 MG/ML INJ: 10.0000 mg | INTRAVENOUS | Status: DC | PRN

## 2020-06-14 MED ORDER — PHENYLEPHRINE 40 MCG/ML (10ML) SYRINGE FOR IV PUSH (FOR BLOOD PRESSURE SUPPORT): 80.0000 ug | PREFILLED_SYRINGE | INTRAVENOUS | Status: DC | PRN

## 2020-06-14 MED ORDER — OXYCODONE-ACETAMINOPHEN 5-325 MG PO TABS: 2.0000 | ORAL_TABLET | ORAL | Status: DC | PRN

## 2020-06-14 MED ORDER — MEASLES, MUMPS & RUBELLA VAC IJ SOLR: 0.5000 mL | Freq: Once | INTRAMUSCULAR | Status: DC

## 2020-06-14 MED ORDER — LIDOCAINE HCL (PF) 1 % IJ SOLN
INTRAMUSCULAR | Status: AC
  Administered 2020-06-14: 30 mL
  Filled 2020-06-14: qty 30

## 2020-06-14 MED ORDER — TETANUS-DIPHTH-ACELL PERTUSSIS 5-2.5-18.5 LF-MCG/0.5 IM SUSP: 0.5000 mL | Freq: Once | INTRAMUSCULAR | Status: DC

## 2020-06-14 MED ORDER — MAGNESIUM SULFATE BOLUS VIA INFUSION
4.0000 g | Freq: Once | INTRAVENOUS | Status: AC
  Administered 2020-06-14: 4 g via INTRAVENOUS
  Filled 2020-06-14: qty 1000

## 2020-06-14 MED ORDER — SOD CITRATE-CITRIC ACID 500-334 MG/5ML PO SOLN: 30.0000 mL | ORAL | Status: DC | PRN

## 2020-06-14 MED ORDER — IBUPROFEN 800 MG PO TABS
800.0000 mg | ORAL_TABLET | Freq: Three times a day (TID) | ORAL | Status: DC
  Administered 2020-06-14 – 2020-06-16 (×6): 800 mg via ORAL
  Filled 2020-06-14 (×6): qty 1

## 2020-06-14 MED ORDER — SIMETHICONE 80 MG PO CHEW: 80.0000 mg | CHEWABLE_TABLET | ORAL | Status: DC | PRN

## 2020-06-14 MED ORDER — DIBUCAINE (PERIANAL) 1 % EX OINT: 1.0000 "application " | TOPICAL_OINTMENT | CUTANEOUS | Status: DC | PRN

## 2020-06-14 MED ORDER — SODIUM CHLORIDE 0.9% FLUSH
3.0000 mL | Freq: Two times a day (BID) | INTRAVENOUS | Status: DC
  Administered 2020-06-15 – 2020-06-16 (×2): 3 mL via INTRAVENOUS

## 2020-06-14 MED ORDER — MAGNESIUM SULFATE 40 GM/1000ML IV SOLN
2.0000 g/h | INTRAVENOUS | Status: DC
  Administered 2020-06-14 – 2020-06-15 (×2): 2 g/h via INTRAVENOUS
  Filled 2020-06-14 (×2): qty 1000

## 2020-06-14 MED ORDER — LACTATED RINGERS IV SOLN: 500.0000 mL | INTRAVENOUS | Status: DC | PRN

## 2020-06-14 MED ORDER — OXYTOCIN BOLUS FROM INFUSION
333.0000 mL | Freq: Once | INTRAVENOUS | Status: AC
  Administered 2020-06-14: 333 mL via INTRAVENOUS

## 2020-06-14 MED ORDER — ONDANSETRON HCL 4 MG PO TABS: 4.0000 mg | ORAL_TABLET | ORAL | Status: DC | PRN

## 2020-06-14 MED ORDER — FERROUS SULFATE 325 (65 FE) MG PO TABS
325.0000 mg | ORAL_TABLET | Freq: Two times a day (BID) | ORAL | Status: DC
  Administered 2020-06-15 – 2020-06-16 (×4): 325 mg via ORAL
  Filled 2020-06-14 (×4): qty 1

## 2020-06-14 NOTE — Progress Notes (Signed)
Pt comfortable.  Vitals:   06/14/20 0111 06/14/20 0403 06/14/20 0608 06/14/20 0713  BP: (!) 141/83 126/63 140/78 126/67  Pulse: 87 75 73 73  Resp: 18     Temp: 98.1 F (36.7 C)     TempSrc: Oral     Weight: 98.2 kg     Height: 5\' 10"  (1.778 m)      FHTs 120s, gSTV, NST R, cat 1 Toco q2-3 SVE per nurse 1/th/-3  A/P G1P0 [redacted]w[redacted]d with GHTN for induction.  S/p cytotec.  Will AROM and start pit if amenable. GBS pos- on PCN.

## 2020-06-14 NOTE — H&P (Signed)
Emily Mccarthy is a 39 y.o. female G1P0 105w1d presenting for IOL due to [facility]. She reports no LOF, VB, Contractions. Normal FM. Denies HA/VC/RUQ pain/SOB. Reports some lower extremity edema  Pregnancy c/b: 1. GHTN: diagnosed at 36w 2. Bilateral fetal pyelectasis - resolved at 22w and 28w scan 3. AMA - normal anatomy and low risk panorama  OB History    Gravida  1   Para      Term      Preterm      AB      Living        SAB      TAB      Ectopic      Multiple      Live Births             Past Medical History:  Diagnosis Date  . Pregnancy induced hypertension    Past Surgical History:  Procedure Laterality Date  . NO PAST SURGERIES     Family History: family history includes Heart attack in her father. Social History:  reports that she has quit smoking. She smoked 0.50 packs per day. She has never used smokeless tobacco. She reports previous alcohol use. She reports that she does not use drugs.     Maternal Diabetes: No Genetic Screening: Normal Maternal Ultrasounds/Referrals: Normal Fetal Ultrasounds or other Referrals:  None Maternal Substance Abuse:  No Significant Maternal Medications:  None Significant Maternal Lab Results:  Group B Strep positive Other Comments:  None  Review of Systems Per HPI Exam Physical Exam  Dilation: 1 Effacement (%): Thick Station: -3 Exam by:: MScott, RN Blood pressure (!) 141/83, pulse 87, temperature 98.1 F (36.7 C), temperature source Oral, resp. rate 18, height 5\' 10"  (1.778 m), weight 98.2 kg. Vitals:   06/14/20 0111  BP: (!) 141/83  Pulse: 87  Resp: 18  Temp: 98.1 F (36.7 C)  TempSrc: Oral  Weight: 98.2 kg  Height: 5\' 10"  (1.778 m)    NAD, resting comfortably Gravid abdomen Trace edema Fetal testing: FHR 125, Cat 1. Toco quiet Prenatal labs: Rubella: Immune (03/15 0000) RPR: Nonreactive (06/30 0000)  HBsAg: Negative (03/15 0000)  HIV: Non-reactive (03/15 0000)  GBS Positive CBC/CMP/UPC  pending  Assessment/Plan: 04-12-1994 39 y.o. G1P0 at [redacted]w[redacted]d here for IOL 2/2 GHTN 1. IOL: On admit 1/thick/-3, cytotec for ripening then pit/AROM prn. Discussed birth plan, previously planning to encapsulate placenta discussed recommendation against that given GBS+.  2. GHTN: CBC/CMP/UPC pending, asymptomatic, mild range BP on admit, cont to monitor  Asees Manfredi K Taam-Akelman 06/14/2020, 7:05 AM

## 2020-06-14 NOTE — Anesthesia Preprocedure Evaluation (Deleted)
Anesthesia Evaluation  Patient identified by MRN, date of birth, ID band Patient awake    Reviewed: Allergy & Precautions, H&P , Patient's Chart, lab work & pertinent test results  Airway Mallampati: II  TM Distance: >3 FB Neck ROM: full    Dental no notable dental hx. (+) Teeth Intact   Pulmonary neg pulmonary ROS, former smoker,    Pulmonary exam normal breath sounds clear to auscultation       Cardiovascular hypertension, negative cardio ROS Normal cardiovascular exam Rhythm:regular Rate:Normal     Neuro/Psych negative neurological ROS  negative psych ROS   GI/Hepatic negative GI ROS, Neg liver ROS,   Endo/Other  Obesity  Renal/GU negative Renal ROS  negative genitourinary   Musculoskeletal   Abdominal   Peds  Hematology  (+) Blood dyscrasia, anemia ,   Anesthesia Other Findings   Reproductive/Obstetrics (+) Pregnancy                             Anesthesia Physical Anesthesia Plan  ASA: III  Anesthesia Plan: Epidural   Post-op Pain Management:    Induction:   PONV Risk Score and Plan:   Airway Management Planned:   Additional Equipment:   Intra-op Plan:   Post-operative Plan:   Informed Consent: I have reviewed the patients History and Physical, chart, labs and discussed the procedure including the risks, benefits and alternatives for the proposed anesthesia with the patient or authorized representative who has indicated his/her understanding and acceptance.       Plan Discussed with: Anesthesiologist  Anesthesia Plan Comments: (Patient delivered prior to placement of epidural. Procedure not performed.)       Anesthesia Quick Evaluation

## 2020-06-14 NOTE — Progress Notes (Signed)
Vitals:   06/14/20 1800 06/14/20 1817 06/14/20 1900 06/14/20 2007  BP: (!) 151/94 139/88 138/81 (!) 142/79  Pulse: 95 92 85 91  Resp:  18 17 18   Temp:  98.1 F (36.7 C) 98.5 F (36.9 C) 98.5 F (36.9 C)  TempSrc:  Oral Oral Oral  SpO2:  100% 97% 99%  Weight:      Height:       BPs are not in severe range but are elevated.   On magnesium sulfate right now.  May need procardia in am if still elevated.

## 2020-06-14 NOTE — Progress Notes (Signed)
Late entry  Nurse had called during induction to alert me to pt having blurry vision and 3 beats of clonus.  Cr on labs came back 0.9- consistent with severe preeclampsia.  Magnesium sulftate was started.

## 2020-06-15 LAB — CBC
HCT: 24.9 % — ABNORMAL LOW (ref 36.0–46.0)
Hemoglobin: 7.7 g/dL — ABNORMAL LOW (ref 12.0–15.0)
MCH: 25.4 pg — ABNORMAL LOW (ref 26.0–34.0)
MCHC: 30.9 g/dL (ref 30.0–36.0)
MCV: 82.2 fL (ref 80.0–100.0)
Platelets: 285 10*3/uL (ref 150–400)
RBC: 3.03 MIL/uL — ABNORMAL LOW (ref 3.87–5.11)
RDW: 13.3 % (ref 11.5–15.5)
WBC: 14.8 10*3/uL — ABNORMAL HIGH (ref 4.0–10.5)
nRBC: 0 % (ref 0.0–0.2)

## 2020-06-15 MED ORDER — LACTATED RINGERS IV SOLN: INTRAVENOUS | Status: DC

## 2020-06-15 NOTE — Lactation Note (Signed)
This note was copied from a baby's chart. Lactation Consultation Note  Patient Name: Emily Mccarthy [id number] Date: 06/15/2020 Reason for consult: Follow-up assessment;Primapara;1st time breastfeeding;Early term 37-38.6wks  LC in to visit with P1 Mom on MgSO4 with her ET infant at 34 hrs old and at 4% weight loss.  Mom trying to latch baby using cross cradle hold.  Mom using great technique and support for baby.  Baby has been sleepy today.   Suggested burping baby before latching, which baby did and then started show subtle cues.  Hand expression done for colostrum on nipple.  Baby able to attain a deep latch to breast with swallows identified.  Taught Mom to use alternate breast compression to increase swallowing.  After 12 mins, baby came off, burped and then suggested Mom offer the second breast.  Baby latched easily and deeply.  Mom encouraged to relax her body and baby noted to be consistently sucking and swallowing.    RN to set up DEBP and instruct Mom on pumping after baby breastfeeds to support her milk supply.  Baby to be fed any EBM she can express by spoon of finger.   Mom to call for assistance prn.    Feeding Feeding Type: Breast Fed  LATCH Score Latch: Grasps breast easily, tongue down, lips flanged, rhythmical sucking.  Audible Swallowing: Spontaneous and intermittent  Type of Nipple: Everted at rest and after stimulation  Comfort (Breast/Nipple): Soft / non-tender  Hold (Positioning): Assistance needed to correctly position infant at breast and maintain latch.  LATCH Score: 9  Interventions Interventions: Breast feeding basics reviewed;Assisted with latch;Skin to skin;Breast massage;Hand express;Breast compression;Adjust position;Support pillows;Position options;DEBP  Lactation Tools Discussed/Used Tools: Pump Breast pump type: Double-Electric Breast Pump Pump Review: Setup, frequency, and cleaning;Milk Storage Initiated by:: Erby Pian RN IBCLC Date  initiated:: 06/15/20   Consult Status Consult Status: Follow-up Date: 06/16/20 Follow-up type: In-patient    Emily Mccarthy 06/15/2020, 1:35 PM

## 2020-06-15 NOTE — Lactation Note (Signed)
This note was copied from a baby's chart. Lactation Consultation Note Baby 9 hrs old. Mom stated when [facility] entered room, she had just finished BF baby and put her down to sleep. Mom and FOB laying down as well. LC reviewed newborn 37 week behavior and feeding habits. Importance of STS and I&O. Mom had a few questions, LC answered them. Mom took BF class a couple of weeks ago.  Mom wants to rent DEBP before leaving hospital. LC mentioned the store where she can rent them. Mom will talk to day time LC before leaving about rentals.  Mom stated RN demonstrated colostrum expression. Mom is on MAG. Pleasant and tired.  Gave mom LC hours for tonight. LC will f/u during the day if mom doesn't call for this [facility] assistance tonight. Lactation brochure given.   Patient Name: Emily Mccarthy CLEXN'T Date: 06/15/2020 Reason for consult: Initial assessment;Primapara;Early term 37-38.6wks   Maternal Data Does the patient have breastfeeding experience prior to this delivery?: No  Feeding Feeding Type: Breast Fed  LATCH Score                   Interventions Interventions: Breast feeding basics reviewed  Lactation Tools Discussed/Used WIC Program: No   Consult Status Consult Status: Follow-up Date: 06/15/20 Follow-up type: In-patient    Rolena Knutson, Diamond Nickel 06/15/2020, 1:20 AM

## 2020-06-15 NOTE — Progress Notes (Addendum)
Post Partum Day 49  39 year old G1, P1 admitted for induction of labor for gestational hypertension  Subjective: Patient notes some persistent mild blurred vision in her left eye, otherwise she is feeling well.  She denies headaches, right upper quadrant or epigastric pain.  She is tolerating p.o. well.  She is ambulating without difficulty and voiding without difficulty.  Objective: Blood pressure 122/68, pulse 88, temperature 98.2 F (36.8 C), temperature source Oral, resp. rate 18, height 5\' 10"  (1.778 m), weight 98.2 kg, SpO2 97 %, unknown if currently breastfeeding.  Vitals:   06/15/20 0418 06/15/20 0500 06/15/20 0612 06/15/20 0723  BP: 125/70   122/68  Pulse: 91   88  Temp: 98.2 F (36.8 C)   98.2 F (36.8 C)  Resp: 18 18 18 18   Height:      Weight:      SpO2: 95%   97%  TempSrc: Oral   Oral  BMI (Calculated):         Physical Exam:  General: alert, cooperative and appears stated age Lochia: appropriate Uterine Fundus: firm DVT Evaluation: No evidence of DVT seen on physical exam. 1-2+ Edema  Recent Labs    06/14/20 0243 06/15/20 0542  HGB 8.6* 7.7*  HCT 27.0* 24.9*    Assessment/Plan:  39 year old G1, P1 admitted for induction of labor for gestational hypertension.  Postpartum she developed severe features   1) preeclampsia: Patient is currently on magnesium sulfate.  This is due to be discontinued at 3:48, 24 hours after delivery.  Currently, patient is normotensive with blood pressures in the 120s to 130s over 60s to 70s.  Will monitor blood pressures closely after magnesium is discontinued.  Addition of antihypertensives may be required.  Patient with some mild persistent blurred vision in the left eye.  This may be related to magnesium sulfate.  Will monitor once mag is discontinued 2) hypertension: Currently not requiring antihypertensive medications 3) anemia of pregnancy: Patient's hemoglobin on admission was 9.  After delivery it dropped to 8.6 and a 7.7 this  morning.  Patient seems to be asymptomatic.  Will continue iron BID with PNV    LOS: 1 day   06/17/20 06/15/2020, 11:22 AM

## 2020-06-16 ENCOUNTER — Inpatient Hospital Stay (HOSPITAL_COMMUNITY): Payer: BC Managed Care – PPO

## 2020-06-16 MED ORDER — ACETAMINOPHEN 500 MG PO TABS: 1000.0000 mg | ORAL_TABLET | Freq: Four times a day (QID) | ORAL | 0 refills | Status: AC | PRN

## 2020-06-16 MED ORDER — IBUPROFEN 600 MG PO TABS: 600.0000 mg | ORAL_TABLET | Freq: Four times a day (QID) | ORAL | 0 refills | Status: AC | PRN

## 2020-06-16 MED ORDER — INFLUENZA VAC SPLIT QUAD 0.5 ML IM SUSY
0.5000 mL | PREFILLED_SYRINGE | INTRAMUSCULAR | Status: AC
  Administered 2020-06-16: 0.5 mL via INTRAMUSCULAR
  Filled 2020-06-16: qty 0.5

## 2020-06-16 NOTE — Lactation Note (Addendum)
This note was copied from a baby's chart. Lactation Consultation Note  Patient Name: Emily Mccarthy [id number] Date: 06/16/2020 Reason for consult: Follow-up assessment;Infant weight loss;Early term 37-38.6wks  LC in to visit with P1 Mom of ET infant on day of discharge.  Baby received another supplement of 7 ml EBM at the breast.    Mom was planning to rent a DEBP from the gift shop, but gift shop is closed again.  Mom states she does have a [employer] pump for home use.  Mom demonstrated the hand pump.  Mom will be breastfeeding and pumping (trying the [facility] or hand pump) to provide 5-20 ml of EBM to be after breastfeeding.   Engorgement prevention and treatment reviewed.  Mom aware of OP lactation resources.  Mom's doula is lactation educated and she will have access to [employer] and a home LC visit.  Mom's MRI was normal, now to have an ophthalmology consult.  Discharge possibly tomorrow.    Judee Clara 06/16/2020, 2:39 PM

## 2020-06-16 NOTE — Lactation Note (Signed)
This note was copied from a baby's chart. Lactation Consultation Note  Patient Name: Emily Mccarthy RCBUL'A Date: 06/16/2020 Reason for consult: Follow-up assessment;Primapara;1st time breastfeeding;Infant weight loss;Early term 37-38.6wks  LC in to visit with P1 Mom of ET infant at 80 hrs old.  Baby at 8.9 % weight loss.   Baby had 3 voids and 3 stools last 24 hrs.  Mom has breastfed baby 10 times last 24 hrs.  Mom feels baby is becoming more active on the breast and Mom is hearing more swallows at the breast.    Mom hasn't pumped since yesterday, so assisted he to pump on initiation setting.  Mom expressed 10 ml of EBM.  Baby starting to cue, so assisted Mom to put baby to the breast.  Mom independently latch baby in cross cradle hold.  Baby had just fed for 12 mins 30 mins prior.  Baby latched with a deep latch and started sucking with swallows.  Instilled 10 ml colostrum by curved tip syringe at breast.  Baby tolerated it well.  Mom taken to MRI due to vision changes during labor that haven't resolved.  Plan- 1- Keep baby STS as much as possible 2- offer breast with cues, feeding baby on both breasts 3-pump both breasts for 15 mins 4- feed baby EBM by spoon, cup or syringe  Mom to ask for help.  Mom plans to rent a DEBP from gift shop on discharge.   Will recommend OP lactation follow-up.  Message sent to clinic.  Feeding Feeding Type: Breast Milk  LATCH Score Latch: Grasps breast easily, tongue down, lips flanged, rhythmical sucking.  Audible Swallowing: Spontaneous and intermittent  Type of Nipple: Everted at rest and after stimulation  Comfort (Breast/Nipple): Soft / non-tender  Hold (Positioning): No assistance needed to correctly position infant at breast.  LATCH Score: 10  Interventions Interventions: Breast feeding basics reviewed;Skin to skin;Breast massage;Hand express;DEBP;Expressed milk  Lactation Tools Discussed/Used Tools: Pump;Flanges Flange Size:  24 Breast pump type: Double-Electric Breast Pump   Consult Status Consult Status: Follow-up Date: 06/17/20 Follow-up type: In-patient    Emily Mccarthy 06/16/2020, 9:56 AM

## 2020-06-16 NOTE — Progress Notes (Signed)
Post Partum Day 37  39 year old G1, P1 admitted for induction of labor for gestational hypertension Subjective: Patient notes persistent blurred vision in left eye. Otherwise not problems or concerns   Objective: Blood pressure 126/78, pulse 90, temperature 98.2 F (36.8 C), temperature source Oral, resp. rate 18, height 5\' 10"  (1.778 m), weight 98.2 kg, SpO2 99 %, unknown if currently breastfeeding.  Physical Exam:  General: alert, cooperative and appears stated age Lochia: appropriate Uterine Fundus: firm DVT Evaluation: No evidence of DVT seen on physical exam. 1-2+ edema. DTRs 1-2 +, no clonus  Recent Labs    06/14/20 0243 06/15/20 0542  HGB 8.6* 7.7*  HCT 27.0* 24.9*    Assessment/Plan: 39 year old G1, P1 admitted for induction of labor for gestational hypertension.  Postpartum she developed severe features  1) Pre-eclampsia: BPs normotensive, blurred vision in left eye persists after coming off magnesium. Discussed with neurology and recommended MRI of brain 2) Hypertension: Normotensive 3) Anemia of pregnancy: Continue iron BID and PNV    LOS: 2 days   24 06/16/2020, 9:05 AM

## 2020-06-16 NOTE — Progress Notes (Signed)
Pt discharged after discharge instructions and prescription given. All questions answered. IV discontinued. Pt discharged in stable condition with all belongings.

## 2020-06-16 NOTE — Discharge Summary (Signed)
Postpartum Discharge Summary      Patient Name: Emily Mccarthy DOB: 11-14-1980 MRN: [id number]  Date of admission: 06/14/2020 Delivery date:06/14/2020  Delivering provider: Bobbye Charleston  Date of discharge: 06/16/2020  Admitting diagnosis: Gestational hypertension [O13.9] Intrauterine pregnancy: [redacted]w[redacted]d    Secondary diagnosis:  Active Problems:   Gestational hypertension      Discharge diagnosis: Term Pregnancy Delivered, Gestational Hypertension and Preeclampsia (severe)                                              Post partum procedures:Brain MRI, left eye blurred vision Augmentation: AROM, Pitocin and Cytotec Complications: None  Hospital course: Induction of Labor With Vaginal Delivery   39y.o. yo G1P1001 at 39w1das admitted to the hospital 06/14/2020 for induction of labor.  Indication for induction: Gestational hypertension and Preeclampsia.  Patient had an uncomplicated labor course as follows: Membrane Rupture Time/Date: 10:02 AM ,06/14/2020   Delivery Method:Vaginal, Spontaneous  Episiotomy: None  Lacerations:  1st degree  Details of delivery can be found in separate delivery note.  Patient had a routine postpartum course. Patient is discharged home 06/16/20.  Newborn Data: Birth date:06/14/2020  Birth time:3:38 PM  Gender:Female  Living status:Living  Apgars:8 ,9  Weight:2915 g   Magnesium Sulfate received: Yes: Seizure prophylaxis BMZ received: No Rhophylac:No MMR:No  Physical exam  Vitals:   06/15/20 2355 06/16/20 0430 06/16/20 0819 06/16/20 1213  BP: 125/76 140/83 126/78 130/82  Pulse: 79 84 90 83  Resp: '18 18 18 18  ' Temp: 98.2 F (36.8 C) 98.2 F (36.8 C)  98.5 F (36.9 C)  TempSrc: Oral Oral  Oral  SpO2: 100% 99% 99% 98%  Weight:      Height:       General: alert, cooperative and no distress Lochia: appropriate Uterine Fundus: firm DVT Evaluation: No evidence of DVT seen on physical exam. Labs: Lab Results  Component Value Date    WBC 14.8 (H) 06/15/2020   HGB 7.7 (L) 06/15/2020   HCT 24.9 (L) 06/15/2020   MCV 82.2 06/15/2020   PLT 285 06/15/2020   CMP Latest Ref Rng & Units 06/14/2020  Glucose 70 - 99 mg/dL 86  BUN 6 - 20 mg/dL 14  Creatinine 0.44 - 1.00 mg/dL 0.92  Sodium 135 - 145 mmol/L 134(L)  Potassium 3.5 - 5.1 mmol/L 4.1  Chloride 98 - 111 mmol/L 105  CO2 22 - 32 mmol/L 18(L)  Calcium 8.9 - 10.3 mg/dL 9.2  Total Protein 6.5 - 8.1 g/dL 5.6(L)  Total Bilirubin 0.3 - 1.2 mg/dL 0.7  Alkaline Phos 38 - 126 U/L 77  AST 15 - 41 U/L 24  ALT 0 - 44 U/L 12   Edinburgh Score: Edinburgh Postnatal Depression Scale Screening Tool 06/15/2020  I have been able to laugh and see the funny side of things. 0  I have looked forward with enjoyment to things. 0  I have blamed myself unnecessarily when things went wrong. 0  I have been anxious or worried for no good reason. 0  I have felt scared or panicky for no good reason. 0  Things have been getting on top of me. 0  I have been so unhappy that I have had difficulty sleeping. 0  I have felt sad or miserable. 0  I have been so unhappy that I have been crying.  0  The thought of harming myself has occurred to me. 0  Edinburgh Postnatal Depression Scale Total 0      After visit meds:  Allergies as of 06/16/2020   No Known Allergies     Medication List    STOP taking these medications   ferrous sulfate 325 (65 FE) MG tablet   multivitamin-prenatal 27-0.8 MG Tabs tablet     TAKE these medications   acetaminophen 500 MG tablet Commonly known as: TYLENOL Take 2 tablets (1,000 mg total) by mouth every 6 (six) hours as needed.   ibuprofen 600 MG tablet Commonly known as: ADVIL Take 1 tablet (600 mg total) by mouth every 6 (six) hours as needed.        Discharge home in stable condition Infant Feeding: Breast Infant Disposition:home with mother Discharge instruction: per After Visit Summary and Postpartum booklet. Activity: Advance as tolerated. Pelvic  rest for 6 weeks.  Diet: routine diet Anticipated Birth Control: Unsure Postpartum Appointment:2-3 days Additional Postpartum F/U: ophthalmology Future Appointments:No future appointments. Follow up Visit:      06/16/2020 Vanessa Kick, MD

## 2020-06-16 NOTE — Progress Notes (Signed)
Patient ID: Emily Mccarthy, female   DOB: Mar 03, 1981, 39 y.o.   MRN: [id number]     EXAM: MRI HEAD WITHOUT CONTRAST  TECHNIQUE: Multiplanar, multiecho pulse sequences of the brain and surrounding structures were obtained without intravenous contrast.  COMPARISON:  None  FINDINGS: Brain: No acute infarction, hemorrhage, hydrocephalus, extra-axial collection or mass lesion. Normal white matter. Pituitary not enlarged.  Vascular: Normal arterial flow voids.  Skull and upper cervical spine: Negative  Sinuses/Orbits: Paranasal sinuses clear.  Negative orbit  Other: None  IMPRESSION: Normal MRI head without contrast.   Electronically Signed   By: Marlan Palau M.D.   On: 06/16/2020 10:34  Discussed pt case with Ophthalmologist on call, reviewed MRI images. Recommended evaluation as outpatient this week. Pt has an ophthalmologist already and will contact for office visit this week.  Pt OK for discharge home from [facility] standpoint. In looking at babies chart, the babies discharge is pending moms discharge. Will discharge home. Patient to follow-up with ophthalmology this week and in our office for BP check later this week

## 2020-06-18 DIAGNOSIS — H43393 Other vitreous opacities, bilateral: Secondary | ICD-10-CM | POA: Diagnosis not present

## 2020-06-18 DIAGNOSIS — H3562 Retinal hemorrhage, left eye: Secondary | ICD-10-CM | POA: Diagnosis not present

## 2020-06-18 DIAGNOSIS — Z9889 Other specified postprocedural states: Secondary | ICD-10-CM | POA: Diagnosis not present

## 2020-06-19 ENCOUNTER — Encounter (INDEPENDENT_AMBULATORY_CARE_PROVIDER_SITE_OTHER): Payer: Self-pay | Admitting: Ophthalmology

## 2020-06-19 ENCOUNTER — Other Ambulatory Visit: Payer: Self-pay

## 2020-06-19 ENCOUNTER — Ambulatory Visit (INDEPENDENT_AMBULATORY_CARE_PROVIDER_SITE_OTHER): Payer: BC Managed Care – PPO | Admitting: Ophthalmology

## 2020-06-19 VITALS — BP 135/84 | HR 89

## 2020-06-19 DIAGNOSIS — H3500 Unspecified background retinopathy: Secondary | ICD-10-CM

## 2020-06-19 DIAGNOSIS — H3581 Retinal edema: Secondary | ICD-10-CM | POA: Diagnosis not present

## 2020-06-19 DIAGNOSIS — H3562 Retinal hemorrhage, left eye: Secondary | ICD-10-CM | POA: Diagnosis not present

## 2020-06-19 DIAGNOSIS — H35032 Hypertensive retinopathy, left eye: Secondary | ICD-10-CM | POA: Diagnosis not present

## 2020-06-19 DIAGNOSIS — O1493 Unspecified pre-eclampsia, third trimester: Secondary | ICD-10-CM

## 2020-06-19 DIAGNOSIS — O133 Gestational [pregnancy-induced] hypertension without significant proteinuria, third trimester: Secondary | ICD-10-CM

## 2020-06-19 NOTE — Progress Notes (Signed)
[facility] - Clinic Note  06/19/2020     CHIEF COMPLAINT Patient presents for Retina Evaluation   HISTORY OF PRESENT ILLNESS: Emily Mccarthy is a 39 y.o. female who presents to the clinic today for:   HPI    Retina Evaluation    In left eye.  This started 5 days ago.  Associated Symptoms Floaters.  Response to treatment was mild improvement.  I, the attending physician,  performed the HPI with the patient and updated documentation appropriately.          Comments    Retina Eval per Dr. Krista Blue. She was told she had bleeding around her left retina.  She was induced at 36 weeks when pregnant on Thursday 06/14/2020.  Her BP went really high and her vision became blurred which never cleared.  They gave her a boost of Magnesium and kept her on an IV for 24 hrs.  BS 170/110 at one point.  She did have a MRI which was normal.  She also has a black "floater" OS that moves around.   Pt is checking her BP every afternoon.  Yesterday 140/80. Hx Lasik OU 2008       Last edited by Joni Reining, COA on 06/19/2020  9:26 AM. (History)    pt just gave birth on Thursday (09.16.21) at [redacted]w[redacted]d and complains of blurry that started during childbirth, she states she was scheduled for an induction, bc her blood pressure had started to rise at 36 weeks, pt states she was put on magnesium during delivery bc of her BP and stayed on it for 24 hours after she gave birth, she feels like vision has improved since she stopped taking it, pt had lasik with Dr. Nile Riggs in 2008, she was not given on BP medication, she has a OB visit this afternoon  Referring physician: Demarco, [place], OD [address] [place],  [place] [address]  HISTORICAL INFORMATION:   Selected notes from the MEDICAL RECORD NUMBER Referred by Dr. [place] DeMarco for concern of valsava retinopathy LEE:  Ocular Hx- PMH-    CURRENT MEDICATIONS: No current outpatient medications on file. (Ophthalmic Drugs)   No current  facility-administered medications for this visit. (Ophthalmic Drugs)   Current Outpatient Medications (Other)  Medication Sig  . acetaminophen (TYLENOL) 500 MG tablet Take 2 tablets (1,000 mg total) by mouth every 6 (six) hours as needed.  Marland Kitchen ibuprofen (ADVIL) 600 MG tablet Take 1 tablet (600 mg total) by mouth every 6 (six) hours as needed.   No current facility-administered medications for this visit. (Other)      REVIEW OF SYSTEMS: ROS    Positive for: Eyes   Negative for: Constitutional, Gastrointestinal, Neurological, Skin, Genitourinary, Musculoskeletal, HENT, Endocrine, Cardiovascular, Respiratory, Psychiatric, Allergic/Imm, Heme/Lymph   Last edited by Joni Reining, COA on 06/19/2020  9:09 AM. (History)       ALLERGIES No Known Allergies  PAST MEDICAL HISTORY Past Medical History:  Diagnosis Date  . Pregnancy induced hypertension    Past Surgical History:  Procedure Laterality Date  . NO PAST SURGERIES      FAMILY HISTORY Family History  Problem Relation Age of Onset  . Heart attack Father   . Sudden death Neg Hx     SOCIAL HISTORY Social History   Tobacco Use  . Smoking status: Former Smoker    Packs/day: 0.50  . Smokeless tobacco: Never Used  Vaping Use  . Vaping Use: Never used  Substance Use Topics  .  Alcohol use: Not Currently    Comment: up to 3 glasses wine a week  . Drug use: No         OPHTHALMIC EXAM:  Base Eye Exam    Visual Acuity (Snellen - Linear)      Right Left   Dist [place] 20/20 20/30 -1  "floater" kept getting in the way when checking vision OS.       Tonometry (Tonopen, 9:21 AM)      Right Left   Pressure 17 15       Pupils      Dark Light Shape React APD   Right 4 3 Round Brisk None   Left 4 3 Round Brisk None       Visual Fields (Counting fingers)      Left Right    Full Full       Extraocular Movement      Right Left    Full Full       Neuro/Psych    Oriented x3: Yes   Mood/Affect: Normal        Dilation    Both eyes: 1.0% Mydriacyl, 2.5% Phenylephrine @ 9:21 AM        Slit Lamp and Fundus Exam    Slit Lamp Exam      Right Left   Lids/Lashes Normal Normal   Conjunctiva/Sclera White and quiet White and quiet   Cornea well healed lasik flap well healed lasik flap   Anterior Chamber Deep and quiet Deep and quiet   Iris Round and dilated Round and dilated   Lens Clear Clear   Vitreous trace Vitreous syneresis trace Vitreous syneresis, no pigment       Fundus Exam      Right Left   Disc Pink and Sharp Pink and Sharp, flame hemes at 0130 and 0600   C/D Ratio 0.5 0.55   Macula Flat, Good foveal reflex, No heme or edema blunted foveal reflex; Scattered IRH and SRH   Vessels Normal mild tortuousity   Periphery Attached, focal pigment clump at 0900, peripheral cystoid degeneration Attached, scattered IRH/SRH posteriorly, mild peripheral cystoid degeneration        Refraction    Manifest Refraction      Sphere Dist VA   Right [place] 20/20   Left [place] NI  OS her refraction was fluctuating from [place] to -1.00 sph without any improvement.           IMAGING AND PROCEDURES  Imaging and Procedures for 06/19/2020  OCT, Retina - OU - Both Eyes       Right Eye Quality was good. Central Foveal Thickness: 268. Progression has no prior data. Findings include normal foveal contour, no IRF, no SRF, vitreomacular adhesion .   Left Eye Quality was good. Central Foveal Thickness: 277. Progression has no prior data. Findings include normal foveal contour, no IRF, no SRF, intraretinal hyper-reflective material, subretinal hyper-reflective material (Scattered patches of IRHM/SRHM corresponding to focal hemorrhages).   Notes *Images captured and stored on drive  Diagnosis / Impression:  OD: NFP, no IRF/SRF OS: Scattered patches of IRHM/SRHM corresponding to focal hemorrhages  Clinical management:  See below  Abbreviations: NFP - Normal foveal profile. CME - cystoid macular  edema. PED - pigment epithelial detachment. IRF - intraretinal fluid. SRF - subretinal fluid. EZ - ellipsoid zone. ERM - epiretinal membrane. ORA - outer retinal atrophy. ORT - outer retinal tubulation. SRHM - subretinal hyper-reflective material. IRHM - intraretinal hyper-reflective material  ASSESSMENT/PLAN:    ICD-10-CM   1. Retinal hemorrhage of left eye  H35.62   2. 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 Gestational hypertension, third trimester  O13.3   6. Pre-eclampsia in third trimester  O14.93     1-6. Retinal hemorrhages OS  - pt reported blurry vision during childbirth on Thursday, September 16  - underwent emergent induction / vaginal delivery due to pre-eclampsia  - BP now normal 130s / 80s; peak BP ~170s / 110s  - BCVA 20/30 OS  - dilated exam shows multiple retinal hemorrhages (subretinal and intraretinal) + disc hemes OS  - likely combination of valsalva retinopathy + hypertensive retinopathy (pre-eclampsia)  - discussed findings and relatively good prognosis  - no acute intervention indicated -- recommend monitoring  - hemorrhages should resolve over time  - f/u 2 weeks -- DFE/OCT   Ophthalmic Meds Ordered this visit:  No orders of the defined types were placed in this encounter.      Return in about 2 weeks (around 07/03/2020) for f/u retinal hemorrhage OS, DFE, OCT.  There are no Patient Instructions on file for this visit.   Explained the diagnoses, plan, and follow up with the patient and they expressed understanding.  Patient expressed understanding of the importance of proper follow up care.   This document serves as a record of services personally performed by Karie Chimera, MD, PhD. It was created on their behalf by Glee Arvin. Manson Passey, OA an ophthalmic technician. The creation of this record is the provider's dictation and/or  activities during the visit.    Electronically signed by: Glee Arvin. Manson Passey, [place] 09.21.2021 11:43 AM   Karie Chimera, M.D., Ph.D. Diseases & Surgery of the Retina and Vitreous [facility]  I have reviewed the above documentation for accuracy and completeness, and I agree with the above. Karie Chimera, M.D., Ph.D. 06/19/20 11:44 AM    Abbreviations: M myopia (nearsighted); A astigmatism; H hyperopia (farsighted); P presbyopia; Mrx spectacle prescription;  CTL contact lenses; OD right eye; OS left eye; OU both eyes  XT exotropia; ET esotropia; PEK punctate epithelial keratitis; PEE punctate epithelial erosions; DES dry eye syndrome; MGD meibomian gland dysfunction; ATs artificial tears; PFAT's preservative free artificial tears; NSC nuclear sclerotic cataract; PSC posterior subcapsular cataract; ERM epi-retinal membrane; PVD posterior vitreous detachment; RD retinal detachment; DM diabetes mellitus; DR diabetic retinopathy; NPDR non-proliferative diabetic retinopathy; PDR proliferative diabetic retinopathy; CSME clinically significant macular edema; DME diabetic macular edema; dbh dot blot hemorrhages; CWS cotton wool spot; POAG primary open angle glaucoma; C/D cup-to-disc ratio; HVF humphrey visual field; GVF goldmann visual field; OCT optical coherence tomography; IOP intraocular pressure; BRVO Branch retinal vein occlusion; CRVO central retinal vein occlusion; CRAO central retinal artery occlusion; BRAO branch retinal artery occlusion; RT retinal tear; SB scleral buckle; PPV pars plana vitrectomy; VH Vitreous hemorrhage; PRP panretinal laser photocoagulation; IVK intravitreal kenalog; VMT vitreomacular traction; MH Macular hole;  NVD neovascularization of the disc; NVE neovascularization elsewhere; AREDS age related eye disease study; ARMD age related macular degeneration; POAG primary open angle glaucoma; EBMD epithelial/anterior basement membrane dystrophy; ACIOL anterior  chamber intraocular lens; IOL intraocular lens; PCIOL posterior chamber intraocular lens; Phaco/IOL phacoemulsification with intraocular lens placement; PRK photorefractive keratectomy; LASIK laser assisted in situ keratomileusis; HTN hypertension; DM diabetes mellitus; COPD chronic obstructive pulmonary disease

## 2020-06-28 NOTE — Progress Notes (Signed)
[facility] - Clinic Note  07/03/2020     CHIEF COMPLAINT Patient presents for Retina Follow Up   HISTORY OF PRESENT ILLNESS: Emily Mccarthy is a 39 y.o. female who presents to the clinic today for:   HPI    Retina Follow Up    Patient presents with  Other (Retinal hemmorhage).  In left eye.  Severity is moderate.  Duration of 2 weeks.  Since onset it is stable.  I, the attending physician,  performed the HPI with the patient and updated documentation appropriately.          Comments    Patient states vision seems better OS. Still sees large floater in vision OS.       Last edited by Rennis Chris, MD on 07/03/2020  8:40 AM. (History)    pt states she can tell her vision is getting better, she can still see the floater, but it's fading, she is following up with her PCP and OBGYN in regards to her blood pressure, she states she is not on any medication for it at the moment, and it is in normal range  Referring physician: Demarco, [place], OD [address] [place],  [place] [address]  HISTORICAL INFORMATION:   Selected notes from the MEDICAL RECORD NUMBER Referred by Dr. [place] DeMarco for concern of valsava retinopathy     CURRENT MEDICATIONS: No current outpatient medications on file. (Ophthalmic Drugs)   No current facility-administered medications for this visit. (Ophthalmic Drugs)   Current Outpatient Medications (Other)  Medication Sig   acetaminophen (TYLENOL) 500 MG tablet Take 2 tablets (1,000 mg total) by mouth every 6 (six) hours as needed.   ibuprofen (ADVIL) 600 MG tablet Take 1 tablet (600 mg total) by mouth every 6 (six) hours as needed.   No current facility-administered medications for this visit. (Other)      REVIEW OF SYSTEMS: ROS    Positive for: Eyes   Negative for: Constitutional, Gastrointestinal, Neurological, Skin, Genitourinary, Musculoskeletal, HENT, Endocrine, Cardiovascular, Respiratory, Psychiatric,  Allergic/Imm, Heme/Lymph   Last edited by Doreene Nest, COT on 07/03/2020  8:24 AM. (History)       ALLERGIES No Known Allergies  PAST MEDICAL HISTORY Past Medical History:  Diagnosis Date   Pregnancy induced hypertension    Past Surgical History:  Procedure Laterality Date   NO PAST SURGERIES      FAMILY HISTORY Family History  Problem Relation Age of Onset   Heart attack Father    Sudden death Neg Hx     SOCIAL HISTORY Social History   Tobacco Use   Smoking status: Former Smoker    Packs/day: 0.50   Smokeless tobacco: Never Used  Building services engineer Use: Never used  Substance Use Topics   Alcohol use: Not Currently    Comment: up to 3 glasses wine a week   Drug use: No         OPHTHALMIC EXAM:  Base Eye Exam    Visual Acuity (Snellen - Linear)      Right Left   Dist [place] 20/20 -1 20/25 -1   Dist ph Kevin  NI       Tonometry (Tonopen, 8:30 AM)      Right Left   Pressure 15 18       Pupils      Dark Light Shape React APD   Right 4 3 Round Brisk None   Left 4 3 Round Brisk None  Visual Fields (Counting fingers)      Left Right    Full Full       Extraocular Movement      Right Left    Full, Ortho Full, Ortho       Neuro/Psych    Oriented x3: Yes   Mood/Affect: Normal       Dilation    Both eyes: 1.0% Mydriacyl, 2.5% Phenylephrine @ 8:30 AM        Slit Lamp and Fundus Exam    Slit Lamp Exam      Right Left   Lids/Lashes Normal Normal   Conjunctiva/Sclera White and quiet White and quiet   Cornea well healed lasik flap well healed lasik flap   Anterior Chamber Deep and quiet Deep and quiet   Iris Round and dilated Round and dilated   Lens Clear Clear   Vitreous trace Vitreous syneresis trace Vitreous syneresis, no pigment       Fundus Exam      Right Left   Disc Pink and Sharp Pink and Sharp, flame hemes at 0130 and 0600 -- resolved   C/D Ratio 0.5 0.55   Macula Flat, Good foveal reflex, No heme or edema  blunted foveal reflex; Scattered IRH and SRH -- fading/improving   Vessels Normal mild tortuousity, mild attenuation   Periphery Attached, focal pigment clump at 0900, peripheral cystoid degeneration Attached, scattered IRH/SRH posteriorly -- fading/improving, mild peripheral cystoid degeneration          IMAGING AND PROCEDURES  Imaging and Procedures for 07/03/2020  OCT, Retina - OU - Both Eyes       Right Eye Quality was good. Central Foveal Thickness: 269. Progression has been stable. Findings include normal foveal contour, no IRF, no SRF, vitreomacular adhesion .   Left Eye Quality was good. Central Foveal Thickness: 277. Progression has improved. Findings include normal foveal contour, no IRF, no SRF, intraretinal hyper-reflective material, subretinal hyper-reflective material, outer retinal atrophy (Interval improvement in [facility] and retinal edema).   Notes *Images captured and stored on drive  Diagnosis / Impression:  OD: NFP, no IRF/SRF OS: Interval improvement in [facility] and retinal edema; en face image shows fading of hemes  Clinical management:  See below  Abbreviations: NFP - Normal foveal profile. CME - cystoid macular edema. PED - pigment epithelial detachment. IRF - intraretinal fluid. SRF - subretinal fluid. EZ - ellipsoid zone. ERM - epiretinal membrane. ORA - outer retinal atrophy. ORT - outer retinal tubulation. SRHM - subretinal hyper-reflective material. IRHM - intraretinal hyper-reflective material                 ASSESSMENT/PLAN:    ICD-10-CM   1. Retinal hemorrhage of left eye  H35.62   2. Retinal edema  H35.81 OCT, Retina - OU - Both Eyes    1-6. 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      Return in about 4 weeks (around 07/31/2020) for f/u retinal hemorrhages OS, DFE, OCT.  There are no Patient Instructions on file for this visit.   Explained the diagnoses, plan, and follow up with the patient and they expressed understanding.  Patient expressed understanding of the importance of proper follow up care.   This document serves as a record of services personally performed by Karie Chimera, MD, PhD. It was created on their behalf by Cristopher Estimable, COT an ophthalmic technician. The creation of this record is the provider's dictation and/or activities during the visit.    Electronically signed by: Cristopher Estimable, COT 9.30.21 @ 12:17 PM  Karie Chimera, M.D., Ph.D. Diseases & Surgery of the Retina and Vitreous [facility] 07/03/2020   I have reviewed the above documentation for accuracy and completeness, and I agree with the above. Karie Chimera, M.D., Ph.D. 07/03/20 12:17 PM   Abbreviations: M myopia (nearsighted); A astigmatism; H hyperopia (farsighted); P presbyopia; Mrx spectacle prescription;  CTL contact lenses; OD right eye; OS left eye; OU both eyes  XT exotropia; ET esotropia; PEK punctate epithelial keratitis; PEE punctate epithelial erosions; DES dry eye syndrome; MGD meibomian gland dysfunction; ATs artificial tears; PFAT's preservative free artificial tears; NSC nuclear sclerotic cataract; PSC posterior subcapsular cataract; ERM epi-retinal membrane; PVD posterior vitreous  detachment; RD retinal detachment; DM diabetes mellitus; DR diabetic retinopathy; NPDR non-proliferative diabetic retinopathy; PDR proliferative diabetic retinopathy; CSME clinically significant macular edema; DME diabetic macular edema; dbh dot blot hemorrhages; CWS cotton wool spot; POAG primary open angle glaucoma; C/D cup-to-disc ratio; HVF humphrey visual field; GVF goldmann visual field; OCT optical coherence tomography; IOP intraocular pressure; BRVO Branch retinal vein occlusion; CRVO central retinal vein occlusion; CRAO central retinal artery occlusion; BRAO branch retinal artery occlusion; RT retinal tear; SB scleral buckle; PPV pars plana vitrectomy; VH Vitreous hemorrhage; PRP panretinal laser photocoagulation; IVK intravitreal kenalog; VMT vitreomacular traction; MH Macular hole;  NVD neovascularization of the disc; NVE neovascularization elsewhere; AREDS age related eye disease study; ARMD age related macular degeneration; POAG primary open angle glaucoma; EBMD epithelial/anterior basement membrane dystrophy; ACIOL anterior chamber intraocular lens; IOL intraocular lens; PCIOL posterior chamber intraocular lens; Phaco/IOL phacoemulsification with intraocular lens placement; PRK photorefractive keratectomy; LASIK laser assisted in situ keratomileusis; HTN hypertension; DM diabetes mellitus; COPD chronic obstructive pulmonary disease

## 2020-07-03 ENCOUNTER — Other Ambulatory Visit: Payer: Self-pay

## 2020-07-03 ENCOUNTER — Encounter (INDEPENDENT_AMBULATORY_CARE_PROVIDER_SITE_OTHER): Payer: Self-pay | Admitting: Ophthalmology

## 2020-07-03 ENCOUNTER — Ambulatory Visit (INDEPENDENT_AMBULATORY_CARE_PROVIDER_SITE_OTHER): Payer: BC Managed Care – PPO | Admitting: Ophthalmology

## 2020-07-03 DIAGNOSIS — H3581 Retinal edema: Secondary | ICD-10-CM

## 2020-07-03 DIAGNOSIS — H3562 Retinal hemorrhage, left eye: Secondary | ICD-10-CM | POA: Diagnosis not present

## 2020-07-26 NOTE — Progress Notes (Signed)
[facility] Clinic Note  07/31/2020     CHIEF COMPLAINT Patient presents for Retina Follow Up   HISTORY OF PRESENT ILLNESS: Emily Mccarthy is a 39 y.o. female who presents to the clinic today for:   HPI    Retina Follow Up    Patient presents with  Other.  In left eye.  This started 4 weeks ago.  I, the attending physician,  performed the HPI with the patient and updated documentation appropriately.          Comments    Patient here for 4 weeks retina follow up for ret hem OS. Patient states vision is slowly getting better. Still has the floater, but way better that was. No eye pain.        Last edited by Bernarda Caffey, MD on 07/31/2020  8:26 AM. (History)    pt feels like her vision is "slowly getting better", she still has a floater, but says it is lightening up  Referring physician: Rutherford Guys, MD No address on file  HISTORICAL INFORMATION:   Selected notes from the [place] Referred by Dr. [place] DeMarco for concern of valsava retinopathy   CURRENT MEDICATIONS: No current outpatient medications on file. (Ophthalmic Drugs)   No current facility-administered medications for this visit. (Ophthalmic Drugs)   Current Outpatient Medications (Other)  Medication Sig  . acetaminophen (TYLENOL) 500 MG tablet Take 2 tablets (1,000 mg total) by mouth every 6 (six) hours as needed.  Marland Kitchen ibuprofen (ADVIL) 600 MG tablet Take 1 tablet (600 mg total) by mouth every 6 (six) hours as needed.   No current facility-administered medications for this visit. (Other)      REVIEW OF SYSTEMS: ROS    Positive for: Eyes   Negative for: Constitutional, Gastrointestinal, Neurological, Skin, Genitourinary, Musculoskeletal, HENT, Endocrine, Cardiovascular, Respiratory, Psychiatric, Allergic/Imm, Heme/Lymph   Last edited by Theodore Demark, COA on 07/31/2020  8:22 AM. (History)       ALLERGIES No Known Allergies  PAST MEDICAL HISTORY Past  Medical History:  Diagnosis Date  . Pregnancy induced hypertension    Past Surgical History:  Procedure Laterality Date  . NO PAST SURGERIES      FAMILY HISTORY Family History  Problem Relation Age of Onset  . Heart attack Father   . Sudden death Neg Hx     SOCIAL HISTORY Social History   Tobacco Use  . Smoking status: Former Smoker    Packs/day: 0.50  . Smokeless tobacco: Never Used  Vaping Use  . Vaping Use: Never used  Substance Use Topics  . Alcohol use: Not Currently    Comment: up to 3 glasses wine a week  . Drug use: No         OPHTHALMIC EXAM:  Base Eye Exam    Visual Acuity (Snellen - Linear)      Right Left   Dist [place] 20/20 20/25   Dist ph   20/20       Tonometry (Tonopen, 8:19 AM)      Right Left   Pressure 16 16       Pupils      Dark Light Shape React APD   Right 4 3 Round Brisk None   Left 4 3 Round Brisk None       Visual Fields (Counting fingers)      Left Right    Full Full       Extraocular Movement  Right Left    Full, Ortho Full, Ortho       Neuro/Psych    Oriented x3: Yes   Mood/Affect: Normal       Dilation    Both eyes: 1.0% Mydriacyl, 2.5% Phenylephrine @ 8:19 AM        Slit Lamp and Fundus Exam    Slit Lamp Exam      Right Left   Lids/Lashes Normal Normal   Conjunctiva/Sclera White and quiet White and quiet   Cornea well healed lasik flap well healed lasik flap   Anterior Chamber Deep and quiet Deep and quiet   Iris Round and dilated Round and dilated   Lens Clear Clear   Vitreous trace Vitreous syneresis trace Vitreous syneresis, no pigment       Fundus Exam      Right Left   Disc Pink and Sharp Pink and Sharp   C/D Ratio 0.5 0.55   Macula Flat, Good foveal reflex, No heme or edema Good foveal reflex; blot hemes fading -- fading   Vessels Normal mild tortuousity, mild attenuation   Periphery Attached, focal pigment clump at 0900, peripheral cystoid degeneration Attached, scattered IRH/SRH  posteriorly -- fading/improving, mild peripheral cystoid degeneration          IMAGING AND PROCEDURES  Imaging and Procedures for 07/31/2020  OCT, Retina - OU - Both Eyes       Right Eye Quality was good. Central Foveal Thickness: 269. Progression has been stable. Findings include normal foveal contour, no IRF, no SRF, vitreomacular adhesion .   Left Eye Quality was good. Central Foveal Thickness: 256. Progression has improved. Findings include normal foveal contour, no IRF, no SRF, subretinal hyper-reflective material, outer retinal atrophy, vitreomacular adhesion  (Interval improvement in IRHM, retinal edema and ORA).   Notes *Images captured and stored on drive  Diagnosis / Impression:  OD: NFP, no IRF/SRF OS: Interval improvement in IRHM, retinal edema and ORA; en face image shows fading of hemes  Clinical management:  See below  Abbreviations: NFP - Normal foveal profile. CME - cystoid macular edema. PED - pigment epithelial detachment. IRF - intraretinal fluid. SRF - subretinal fluid. EZ - ellipsoid zone. ERM - epiretinal membrane. ORA - outer retinal atrophy. ORT - outer retinal tubulation. SRHM - subretinal hyper-reflective material. IRHM - intraretinal hyper-reflective material                 ASSESSMENT/PLAN:    ICD-10-CM   1. Retinal hemorrhage of left eye  H35.62   2. 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 Gestational hypertension, third trimester  O13.3   6. Pre-eclampsia in third trimester  O14.93     1-6. Retinal hemorrhages OS -- improving  - pt reported blurry vision during childbirth on Thursday, September 16  - underwent emergent induction / vaginal delivery due to pre-eclampsia  - BP now normal; peak BP ~170s / 110s  - BCVA improved to 20/20 OS from 20/25  - dilated exam shows interval improvement in retinal hemorrhages (now just faded  intraretinal dot hemes), disc hemes stably resolved OS  - likely combination of valsalva retinopathy + hypertensive retinopathy (pre-eclampsia)  - discussed findings and good prognosis  - no acute intervention indicated -- recommend continued monitoring  - f/u 6-8 weeks -- DFE/OCT   Ophthalmic Meds Ordered this visit:  No orders of the  defined types were placed in this encounter.      Return for f/u 6-8 weeks, retinal hemorrhages OS, DFE, OCT.  There are no Patient Instructions on file for this visit.   Explained the diagnoses, plan, and follow up with the patient and they expressed understanding.  Patient expressed understanding of the importance of proper follow up care.   This document serves as a record of services personally performed by Gardiner Sleeper, MD, PhD. It was created on their behalf by Estill Bakes, COT an ophthalmic technician. The creation of this record is the provider's dictation and/or activities during the visit.    Electronically signed by: Estill Bakes, COT 10.28.21 @ 8:50 AM   This document serves as a record of services personally performed by Gardiner Sleeper, MD, PhD. It was created on their behalf by San Jetty. Owens Shark, OA an ophthalmic technician. The creation of this record is the provider's dictation and/or activities during the visit.    Electronically signed by: San Jetty. Marguerita Merles 11.02.2021 8:50 AM  Gardiner Sleeper, M.D., Ph.D. Diseases & Surgery of the Retina and [place] 07/31/2020   I have reviewed the above documentation for accuracy and completeness, and I agree with the above. Gardiner Sleeper, M.D., Ph.D. 07/31/20 8:50 AM   Abbreviations: M myopia (nearsighted); A astigmatism; H hyperopia (farsighted); P presbyopia; Mrx spectacle prescription;  CTL contact lenses; OD right eye; OS left eye; OU both eyes  XT exotropia; ET esotropia; PEK punctate epithelial keratitis; PEE punctate epithelial erosions; DES  dry eye syndrome; MGD meibomian gland dysfunction; ATs artificial tears; PFAT's preservative free artificial tears; [place] nuclear sclerotic cataract; PSC posterior subcapsular cataract; ERM epi-retinal membrane; PVD posterior vitreous detachment; RD retinal detachment; DM diabetes mellitus; DR diabetic retinopathy; NPDR non-proliferative diabetic retinopathy; PDR proliferative diabetic retinopathy; CSME clinically significant macular edema; DME diabetic macular edema; dbh dot blot hemorrhages; CWS cotton wool spot; POAG primary open angle glaucoma; C/D cup-to-disc ratio; HVF humphrey visual field; GVF goldmann visual field; OCT optical coherence tomography; IOP intraocular pressure; BRVO Branch retinal vein occlusion; CRVO central retinal vein occlusion; CRAO central retinal artery occlusion; BRAO branch retinal artery occlusion; RT retinal tear; SB scleral buckle; PPV pars plana vitrectomy; VH Vitreous hemorrhage; PRP panretinal laser photocoagulation; IVK intravitreal kenalog; VMT vitreomacular traction; MH Macular hole;  NVD neovascularization of the disc; NVE neovascularization elsewhere; AREDS age related eye disease study; ARMD age related macular degeneration; POAG primary open angle glaucoma; EBMD epithelial/anterior basement membrane dystrophy; ACIOL anterior chamber intraocular lens; IOL intraocular lens; PCIOL posterior chamber intraocular lens; Phaco/IOL phacoemulsification with intraocular lens placement; [place] photorefractive keratectomy; LASIK laser assisted in situ keratomileusis; HTN hypertension; DM diabetes mellitus; COPD chronic obstructive pulmonary disease

## 2020-07-31 ENCOUNTER — Ambulatory Visit (INDEPENDENT_AMBULATORY_CARE_PROVIDER_SITE_OTHER): Payer: BC Managed Care – PPO | Admitting: Ophthalmology

## 2020-07-31 ENCOUNTER — Encounter (INDEPENDENT_AMBULATORY_CARE_PROVIDER_SITE_OTHER): Payer: Self-pay | Admitting: Ophthalmology

## 2020-07-31 ENCOUNTER — Other Ambulatory Visit: Payer: Self-pay

## 2020-07-31 DIAGNOSIS — H35032 Hypertensive retinopathy, left eye: Secondary | ICD-10-CM

## 2020-07-31 DIAGNOSIS — H3562 Retinal hemorrhage, left eye: Secondary | ICD-10-CM

## 2020-07-31 DIAGNOSIS — H3581 Retinal edema: Secondary | ICD-10-CM

## 2020-07-31 DIAGNOSIS — O133 Gestational [pregnancy-induced] hypertension without significant proteinuria, third trimester: Secondary | ICD-10-CM

## 2020-07-31 DIAGNOSIS — H3500 Unspecified background retinopathy: Secondary | ICD-10-CM

## 2020-07-31 DIAGNOSIS — O1493 Unspecified pre-eclampsia, third trimester: Secondary | ICD-10-CM

## 2020-10-01 NOTE — Progress Notes (Signed)
[facility] Note  10/05/2020     CHIEF COMPLAINT Patient presents for Retina Follow Up   HISTORY OF PRESENT ILLNESS: Emily Mccarthy is a 40 y.o. female who presents to the clinic today for:   HPI    Retina Follow Up    Patient presents with  Other.  In left eye.  Duration of 9 weeks.  I, the attending physician,  performed the HPI with the patient and updated documentation appropriately.          Comments    9 week follow up Ret heme OS- She doesn't think her eye is getting any better.  Vision is still blurry.         Last edited by Bernarda Caffey, MD on 10/05/2020  9:42 AM. (History)    pt states she doesn't feel like her vision is getting any better  Referring physician: No referring provider defined for this encounter.  HISTORICAL INFORMATION:   Selected notes from the MEDICAL RECORD NUMBER Referred by Dr. Martinique DeMarco for concern of valsava retinopathy   CURRENT MEDICATIONS: No current outpatient medications on file. (Ophthalmic Drugs)   No current facility-administered medications for this visit. (Ophthalmic Drugs)   Current Outpatient Medications (Other)  Medication Sig  . acetaminophen (TYLENOL) 500 MG tablet Take 2 tablets (1,000 mg total) by mouth every 6 (six) hours as needed.  Marland Kitchen ibuprofen (ADVIL) 600 MG tablet Take 1 tablet (600 mg total) by mouth every 6 (six) hours as needed.   No current facility-administered medications for this visit. (Other)      REVIEW OF SYSTEMS: ROS    Positive for: Eyes   Negative for: Constitutional, Gastrointestinal, Neurological, Skin, Genitourinary, Musculoskeletal, HENT, Endocrine, Cardiovascular, Respiratory, Psychiatric, Allergic/Imm, Heme/Lymph   Last edited by Leonie Douglas, COA on 10/05/2020  9:24 AM. (History)       ALLERGIES No Known Allergies  PAST MEDICAL HISTORY Past Medical History:  Diagnosis Date  . Pregnancy induced hypertension    Past Surgical History:  Procedure  Laterality Date  . NO PAST SURGERIES      FAMILY HISTORY Family History  Problem Relation Age of Onset  . Heart attack Father   . Sudden death Neg Hx     SOCIAL HISTORY Social History   Tobacco Use  . Smoking status: Former Smoker    Packs/day: 0.50  . Smokeless tobacco: Never Used  Vaping Use  . Vaping Use: Never used  Substance Use Topics  . Alcohol use: Not Currently    Comment: up to 3 glasses wine a week  . Drug use: No         OPHTHALMIC EXAM:  Base Eye Exam    Visual Acuity (Snellen - Linear)      Right Left   Dist [place] 20/20 20/20 -2       Tonometry (Tonopen, 9:28 AM)      Right Left   Pressure 18 17       Pupils      Dark Light Shape React APD   Right 4 3 Round Brisk None   Left 4 3 Round Brisk None       Visual Fields (Counting fingers)      Left Right    Full Full       Extraocular Movement      Right Left    Full Full       Neuro/Psych    Oriented x3: Yes   Mood/Affect: Normal  Dilation    Both eyes: 1.0% Mydriacyl, 2.5% Phenylephrine @ 9:28 AM        Slit Lamp and Fundus Exam    Slit Lamp Exam      Right Left   Lids/Lashes Normal Normal   Conjunctiva/Sclera White and quiet White and quiet   Cornea well healed lasik flap well healed lasik flap   Anterior Chamber Deep and quiet Deep and quiet   Iris Round and dilated Round and dilated   Lens Clear Clear   Vitreous trace Vitreous syneresis trace Vitreous syneresis, no pigment       Fundus Exam      Right Left   Disc Pink and Sharp Pink and Sharp   C/D Ratio 0.5 0.55   Macula Flat, Good foveal reflex, No heme or edema Good foveal reflex; blot hemes fading -- almost resolved   Vessels Normal mild tortuousity, mild attenuation   Periphery Attached, focal pigment clump at 0900, peripheral cystoid degeneration Attached, scattered IRH/SRH posteriorly -- fading/improving, mild peripheral cystoid degeneration          IMAGING AND PROCEDURES  Imaging and Procedures for  10/05/2020  OCT, Retina - OU - Both Eyes       Right Eye Quality was good. Central Foveal Thickness: 273. Progression has been stable. Findings include normal foveal contour, no IRF, no SRF, vitreomacular adhesion .   Left Eye Quality was good. Central Foveal Thickness: 260. Progression has improved. Findings include normal foveal contour, no IRF, no SRF, subretinal hyper-reflective material, outer retinal atrophy, vitreomacular adhesion  (Mild Interval improvement in IRHM, retinal edema and ORA).   Notes *Images captured and stored on drive  Diagnosis / Impression:  OD: NFP, no IRF/SRF OS: Mild Interval improvement in IRHM, retinal edema and ORA  Clinical management:  See below  Abbreviations: NFP - Normal foveal profile. CME - cystoid macular edema. PED - pigment epithelial detachment. IRF - intraretinal fluid. SRF - subretinal fluid. EZ - ellipsoid zone. ERM - epiretinal membrane. ORA - outer retinal atrophy. ORT - outer retinal tubulation. SRHM - subretinal hyper-reflective material. IRHM - intraretinal hyper-reflective material                 ASSESSMENT/PLAN:    ICD-10-CM   1. Retinal hemorrhage of left eye  H35.62   2. 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 Gestational hypertension, third trimester  O13.3   6. Pre-eclampsia in third trimester  O14.93     1-6. Retinal hemorrhages OS -- improving  - pt reported blurry vision during childbirth on Thursday, September 16  - underwent emergent induction / vaginal delivery due to pre-eclampsia  - BP now normal; peak BP ~170s / 110s  - BCVA stable at 20/20 OS   - dilated exam shows interval improvement in retinal hemorrhages (now just faded intraretinal blot hemes, almost resolved), disc hemes stably resolved OS  - likely combination of valsalva retinopathy + hypertensive retinopathy (pre-eclampsia)  - discussed  findings and good prognosis  - no acute intervention indicated -- recommend continued monitoring  - can f/u here PRN   Ophthalmic Meds Ordered this visit:  No orders of the defined types were placed in this encounter.      Return if symptoms worsen or fail to improve.  There are no Patient Instructions on file for this visit.   Explained the diagnoses,  plan, and follow up with the patient and they expressed understanding.  Patient expressed understanding of the importance of proper follow up care.   This document serves as a record of services personally performed by Gardiner Sleeper, MD, PhD. It was created on their behalf by San Jetty. Owens Shark, OA an ophthalmic technician. The creation of this record is the provider's dictation and/or activities during the visit.    Electronically signed by: San Jetty. Owens Shark, [place] 01.03.2022 1:12 PM  Gardiner Sleeper, M.D., Ph.D. Diseases & Surgery of the Retina and Vitreous [facility]  I have reviewed the above documentation for accuracy and completeness, and I agree with the above. Gardiner Sleeper, M.D., Ph.D. 10/05/20 1:12 PM   Abbreviations: M myopia (nearsighted); A astigmatism; H hyperopia (farsighted); P presbyopia; Mrx spectacle prescription;  CTL contact lenses; OD right eye; OS left eye; OU both eyes  XT exotropia; ET esotropia; PEK punctate epithelial keratitis; PEE punctate epithelial erosions; DES dry eye syndrome; MGD meibomian gland dysfunction; ATs artificial tears; PFAT's preservative free artificial tears; [place] nuclear sclerotic cataract; PSC posterior subcapsular cataract; ERM epi-retinal membrane; PVD posterior vitreous detachment; RD retinal detachment; DM diabetes mellitus; DR diabetic retinopathy; NPDR non-proliferative diabetic retinopathy; PDR proliferative diabetic retinopathy; CSME clinically significant macular edema; DME diabetic macular edema; dbh dot blot hemorrhages; CWS cotton wool spot; POAG primary  open angle glaucoma; C/D cup-to-disc ratio; HVF humphrey visual field; GVF goldmann visual field; OCT optical coherence tomography; IOP intraocular pressure; BRVO Branch retinal vein occlusion; CRVO central retinal vein occlusion; CRAO central retinal artery occlusion; BRAO branch retinal artery occlusion; RT retinal tear; SB scleral buckle; PPV pars plana vitrectomy; VH Vitreous hemorrhage; PRP panretinal laser photocoagulation; IVK intravitreal kenalog; VMT vitreomacular traction; MH Macular hole;  NVD neovascularization of the disc; NVE neovascularization elsewhere; AREDS age related eye disease study; ARMD age related macular degeneration; POAG primary open angle glaucoma; EBMD epithelial/anterior basement membrane dystrophy; ACIOL anterior chamber intraocular lens; IOL intraocular lens; PCIOL posterior chamber intraocular lens; Phaco/IOL phacoemulsification with intraocular lens placement; [place] photorefractive keratectomy; LASIK laser assisted in situ keratomileusis; HTN hypertension; DM diabetes mellitus; COPD chronic obstructive pulmonary disease

## 2020-10-05 ENCOUNTER — Encounter (INDEPENDENT_AMBULATORY_CARE_PROVIDER_SITE_OTHER): Payer: Self-pay | Admitting: Ophthalmology

## 2020-10-05 ENCOUNTER — Ambulatory Visit (INDEPENDENT_AMBULATORY_CARE_PROVIDER_SITE_OTHER): Payer: 59 | Admitting: Ophthalmology

## 2020-10-05 ENCOUNTER — Other Ambulatory Visit: Payer: Self-pay

## 2020-10-05 DIAGNOSIS — O1493 Unspecified pre-eclampsia, third trimester: Secondary | ICD-10-CM

## 2020-10-05 DIAGNOSIS — H3562 Retinal hemorrhage, left eye: Secondary | ICD-10-CM

## 2020-10-05 DIAGNOSIS — H3500 Unspecified background retinopathy: Secondary | ICD-10-CM | POA: Diagnosis not present

## 2020-10-05 DIAGNOSIS — H35032 Hypertensive retinopathy, left eye: Secondary | ICD-10-CM | POA: Diagnosis not present

## 2020-10-05 DIAGNOSIS — O133 Gestational [pregnancy-induced] hypertension without significant proteinuria, third trimester: Secondary | ICD-10-CM

## 2020-10-05 DIAGNOSIS — H3581 Retinal edema: Secondary | ICD-10-CM | POA: Diagnosis not present

## 2021-12-16 ENCOUNTER — Ambulatory Visit (HOSPITAL_BASED_OUTPATIENT_CLINIC_OR_DEPARTMENT_OTHER): Payer: 59 | Admitting: Nurse Practitioner

## 2021-12-16 ENCOUNTER — Other Ambulatory Visit: Payer: Self-pay

## 2021-12-16 ENCOUNTER — Encounter (HOSPITAL_BASED_OUTPATIENT_CLINIC_OR_DEPARTMENT_OTHER): Payer: Self-pay | Admitting: Nurse Practitioner

## 2021-12-16 VITALS — BP 117/72 | HR 91 | Ht 70.0 in | Wt 172.0 lb

## 2021-12-16 DIAGNOSIS — M940 Chondrocostal junction syndrome [Tietze]: Secondary | ICD-10-CM | POA: Insufficient documentation

## 2021-12-16 DIAGNOSIS — G4709 Other insomnia: Secondary | ICD-10-CM | POA: Diagnosis not present

## 2021-12-16 DIAGNOSIS — R6882 Decreased libido: Secondary | ICD-10-CM | POA: Diagnosis not present

## 2021-12-16 DIAGNOSIS — Z1329 Encounter for screening for other suspected endocrine disorder: Secondary | ICD-10-CM

## 2021-12-16 DIAGNOSIS — Z130 Encounter for screening for diseases of the blood and blood-forming organs and certain disorders involving the immune mechanism: Secondary | ICD-10-CM | POA: Insufficient documentation

## 2021-12-16 DIAGNOSIS — Z13228 Encounter for screening for other metabolic disorders: Secondary | ICD-10-CM

## 2021-12-16 DIAGNOSIS — Z1321 Encounter for screening for nutritional disorder: Secondary | ICD-10-CM

## 2021-12-16 MED ORDER — BUPROPION HCL ER (XL) 150 MG PO TB24: 150.0000 mg | ORAL_TABLET | Freq: Every day | ORAL | 3 refills | Status: DC

## 2021-12-16 MED ORDER — TRAZODONE HCL 50 MG PO TABS: 50.0000 mg | ORAL_TABLET | Freq: Every day | ORAL | 3 refills | Status: DC

## 2021-12-16 MED ORDER — DICLOFENAC SODIUM 1 % EX GEL: 4.0000 g | Freq: Four times a day (QID) | CUTANEOUS | 1 refills | Status: DC | PRN

## 2021-12-16 NOTE — Progress Notes (Signed)
Tollie Eth, DNP, AGNP-c Primary Care & Sports Medicine [address]  [address] [place], [place] [address] [phone]  New patient visit   Patient: Emily Mccarthy   DOB: April 10, 1981   41 y.o. Female  MRN: [id number] Visit Date: 12/16/2021  Patient Care Team: Keishawn Rajewski, Sung Amabile, NP as PCP - General (Nurse Practitioner)  Today's healthcare provider: Tollie Eth, NP   Chief Complaint  Patient presents with   New Patient (Initial Visit)    Patient presents today to establish care. She was a previous patient at [facility]. She would like to start back on a medication trazadone. She also is concerned with chest cavity pain when sneezing coughing or push ups.    Subjective    Emily Mccarthy is a 41 y.o. female who presents today as a new patient to establish care.    Patient endorses the following concerns presently:  Trazodone - previously used for sleep - stopped during pregnancy - would like to try again - not sleeping well at night with frequent wakening and difficulty falling asleep - has 34 month old daughter at home, not breast feeding  Libido - decreased desire for initimacy since birth of her daughter - still attracted to her SO and enjoys his company - concerned this is upsetting to her SO  - not getting as much rest as she would like  Sternal pain - intermittent mid sternal pain for about 6 months - noticeable when doing certain yoga moves, push-ups, coughing, and sneezing.  - not present with lifting her daughter, regular arm movements, deep breaths - lasts only a few seconds - described as sharp and localized - does not stop her from continuing her activities or movements - no radiation, jaw pain, nausea, ShOB, dizziness - tenderness with deep palpation of  - high to low plank - push ups - no pain with lifting  - with coughing    Yoga and piliates daily.   History reviewed and reveals the following: Past Medical History:  Diagnosis  Date   Gestational hypertension 06/11/2020   Left elbow pain 06/24/2011   Neck muscle spasm 03/05/2017   Nicotine addiction 06/13/2012   Pregnancy induced hypertension    Right wrist injury 06/17/2011   Past Surgical History:  Procedure Laterality Date   NO PAST SURGERIES     Family Status  Relation Name Status   Mother  Alive   Father  Deceased   Sister  Alive   Neg Hx  (Not Specified)   Family History  Problem Relation Age of Onset   Heart attack Father    Sudden death Neg Hx    Social History   Socioeconomic History   Marital status: Single    Spouse name: Not on file   Number of children: Not on file   Years of education: Not on file   Highest education level: Not on file  Occupational History   Occupation: sale    Comment: Tax inspector  Tobacco Use   Smoking status: Former    Packs/day: 0.50    Types: Cigarettes   Smokeless tobacco: Never  Vaping Use   Vaping Use: Never used  Substance and Sexual Activity   Alcohol use: Not Currently    Comment: up to 3 glasses wine a week   Drug use: No   Sexual activity: Yes    Birth control/protection: Other-see comments    Comment: nuvaring  Other Topics Concern   Not on file  Social History Narrative   Marital status: no   Children: no   Lives with: boyfriend   Employment: Airline pilot   Tobacco:  no   Alcohol:  weekly   Drugs:  no   Exercise:  5x/week   Seatbelt: 100%   Guns in home: no         Social Determinants of Corporate investment banker Strain: Not on file  Food Insecurity: Not on file  Transportation Needs: Not on file  Physical Activity: Not on file  Stress: Not on file  Social Connections: Not on file   Outpatient Medications Prior to Visit  Medication Sig   acetaminophen (TYLENOL) 500 MG tablet Take 2 tablets (1,000 mg total) by mouth every 6 (six) hours as needed.   etonogestrel-ethinyl estradiol (NUVARING) 0.12-0.015 MG/24HR vaginal ring NuvaRing 0.12 mg-0.015 mg/24 hr vaginal  INSERT  1 VAGINAL RING BY VAGINAL ROUTE AS DIRECTED.   ibuprofen (ADVIL) 600 MG tablet Take 1 tablet (600 mg total) by mouth every 6 (six) hours as needed.   No facility-administered medications prior to visit.   No Known Allergies Immunization History  Administered Date(s) Administered   Influenza Inj Mdck Quad With Preservative 07/30/2018   Influenza, Seasonal, Injecte, Preservative Fre 09/08/2012   Influenza,inj,Quad PF,6+ Mos 11/23/2013, 08/12/2017, 07/30/2018, 06/16/2020   Influenza-Unspecified 06/29/2014, 09/07/2015, 08/15/2016, 08/01/2017, 08/15/2017, 07/01/2021   Tdap 04/23/2020    Health Maintenance Due: Health Maintenance  Topic Date Due   COVID-19 Vaccine (1) Never done   Hepatitis C Screening  Never done   PAP SMEAR-Modifier  09/07/2022   TETANUS/TDAP  04/23/2030   INFLUENZA VACCINE  Completed   HIV Screening  Completed   HPV VACCINES  Aged Out    Review of Systems All review of systems negative except what is listed in the HPI   Objective    BP 117/72   Pulse 91   Ht 5\' 10"  (1.778 m)   Wt 172 lb (78 kg)   SpO2 98%   BMI 24.68 kg/m  Physical Exam Vitals and nursing note reviewed.  Constitutional:      General: She is not in acute distress.    Appearance: Normal appearance.  Eyes:     Extraocular Movements: Extraocular movements intact.     Conjunctiva/sclera: Conjunctivae normal.     Pupils: Pupils are equal, round, and reactive to light.  Neck:     Vascular: No carotid bruit.  Cardiovascular:     Rate and Rhythm: Normal rate and regular rhythm.     Pulses: Normal pulses.     Heart sounds: Normal heart sounds. No murmur heard. Pulmonary:     Effort: Pulmonary effort is normal.     Breath sounds: Normal breath sounds. No wheezing.  Chest:     Chest wall: Tenderness and edema present.    Abdominal:     General: Bowel sounds are normal.     Palpations: Abdomen is soft.  Musculoskeletal:        General: Normal range of motion.     Cervical back:  Normal range of motion.     Right lower leg: No edema.     Left lower leg: No edema.  Skin:    General: Skin is warm and dry.     Capillary Refill: Capillary refill takes less than 2 seconds.  Neurological:     General: No focal deficit present.     Mental Status: She is alert and oriented to person, place, and time.  Psychiatric:  Mood and Affect: Mood normal.        Behavior: Behavior normal.        Thought Content: Thought content normal.        Judgment: Judgment normal.    No results found for any visits on 12/16/21.  Assessment & Plan      Problem List Items Addressed This Visit     Other insomnia - Primary    Difficulty falling and staying asleep with previous use of trazodone with success.  Patient is no longer pregnant or breast feeding and is on appropriate contraceptive management with no plans of conceiving in the near future.  Will plan to restart trazodone 50mg  qHS and monitor for improvement.  F/U if sx worsen or fail to improve.       Relevant Medications   traZODone (DESYREL) 50 MG tablet   Costochondritis    Pin-point tenderness and intermittent sharp pain noted at the 4th sternocostal joint space consistent with costochondritis.  Pain is intermittent in nature and only with specific movements. No alarm symptoms present at this time. Suspect cartilage movement is causing inflammation locally resulting in pain.  Discussed findings with Dr. Ihor Dow during visit and he is in agreement to assessment and findings.  Discussed the etiology with the patient. Recommend trial of topical diclofenac to try to reduce inflammation and help prevent further pain. No alarm symptoms present. HRRR, lungs CTA throughout.  Recommend RTC if pain worsens or changes or if new symptoms present. Can consider imaging and possible referral to orthopedics for further evaluation.       Relevant Medications   diclofenac Sodium (VOLTAREN) 1 % GEL   Other Relevant Orders   VITAMIN D  25 Hydroxy (Vit-D Deficiency, Fractures)   Screening for endocrine, nutritional, metabolic and immunity disorder   Relevant Orders   CBC with Differential/Platelet   Comprehensive metabolic panel   TSH   VITAMIN D 25 Hydroxy (Vit-D Deficiency, Fractures)   LDL cholesterol, direct   Low libido    Decreased sexual drive and desire. Discussed with patient that this is a common finding among new mothers and may be associated with hormonal changes and fatigue.  Plan to restart trazodone for sleep. Discussed option to trial bupropion to see if this is helpful in restoring energy and sexual desire.  Will also monitor labs today.  If no changes with medication, will consider change in birth control options as this could be contributing.  She is in agreement to plan.        Relevant Medications   buPROPion (WELLBUTRIN XL) 150 MG 24 hr tablet     Return for TBD based on labs.      Braulio Kiedrowski, Sung Amabile, NP, DNP, AGNP-C Primary Care & Sports Medicine at [facility]

## 2021-12-16 NOTE — Assessment & Plan Note (Signed)
Pin-point tenderness and intermittent sharp pain noted at the 4th sternocostal joint space consistent with costochondritis.  ?Pain is intermittent in nature and only with specific movements. No alarm symptoms present at this time. Suspect cartilage movement is causing inflammation locally resulting in pain.  ?Discussed findings with Dr. Ihor Dow during visit and he is in agreement to assessment and findings.  ?Discussed the etiology with the patient. Recommend trial of topical diclofenac to try to reduce inflammation and help prevent further pain. No alarm symptoms present. HRRR, lungs CTA throughout.  ?Recommend RTC if pain worsens or changes or if new symptoms present. Can consider imaging and possible referral to orthopedics for further evaluation.  ?

## 2021-12-16 NOTE — Assessment & Plan Note (Signed)
Decreased sexual drive and desire. Discussed with patient that this is a common finding among new mothers and may be associated with hormonal changes and fatigue.  ?Plan to restart trazodone for sleep. Discussed option to trial bupropion to see if this is helpful in restoring energy and sexual desire.  ?Will also monitor labs today.  ?If no changes with medication, will consider change in birth control options as this could be contributing.  ?She is in agreement to plan.  ? ?

## 2021-12-16 NOTE — Patient Instructions (Signed)
Thank you for choosing [place] [facility] for your Primary Care needs. I am excited for the opportunity to partner with you to meet your health care goals. It was a pleasure meeting you today! ? ?Recommendations from today's visit: ?I have put some information on costochondritis on the back of this packet for you to look over. You can try the diclofenac to see if this helps reduce the inflammation and pain in the sternum. If this does not help or if this gets any worse, please let me know. We can always consider imaging of the area.  ?I have sent the wellbutrin to see if this helps increase your energy and libido. Take this in the morning as it can increase your energy levels. If this does not help after a couple of weeks, please let me know. ?I have sent the trazodone for sleep. Let me know if this is not a helpful dose and we can adjust accordingly.  ?I will let you know what the labs show and if we have any further recommendations.  ? ?Information on diet, exercise, and health maintenance recommendations are listed below. This is information to help you be sure you are on track for optimal health and monitoring.  ? ?Please look over this and let us know if you have any questions or if you have completed any of the health maintenance outside of [facility] so that we can be sure your records are up to date.  ?___________________________________________________________ ?About Me: ?I am an Adult-Geriatric Nurse Practitioner with a background in caring for patients for more than 20 years with a strong intensive care background. I provide primary care and sports medicine services to patients age 4 and older within this office. My education had a strong focus on caring for the older adult population, which I am passionate about. I am also the director of the APP Fellowship with [facility].  ? ?My desire is to provide you with the best service through preventive medicine and supportive  care. I consider you a part of the medical team and value your input. I work diligently to ensure that you are heard and your needs are met in a safe and effective manner. I want you to feel comfortable with me as your provider and want you to know that your health concerns are important to me. ? ?For your information, our office hours are: ?Monday, Tuesday, and Thursday 8:00 AM - 5:00 PM ?Wednesday and Friday 8:00 AM - 12:00 PM.  ? ?In my time away from the office I am teaching new APP's within the system and am unavailable, but my partner, Dr. Burnard Bunting is in the office for emergent needs.  ? ?If you have questions or concerns, please call our office at [phone] or send [place] a MyChart message and we will respond as quickly as possible.  ?____________________________________________________________ ?MyChart:  ?For all urgent or time sensitive needs we ask that you please call the office to avoid delays. Our number is [phone]. ?MyChart is not constantly monitored and due to the large volume of messages a day, replies may take up to 72 business hours. ? ?MyChart Policy: ?MyChart allows for you to see your visit notes, after visit summary, provider recommendations, lab and tests results, make an appointment, request refills, and contact your provider or the office for non-urgent questions or concerns. Providers are seeing patients during normal business hours and do not have built in time to review MyChart messages.  ?We ask  that you allow a minimum of 3 business days for responses to [employer]. For this reason, please do not send urgent requests through [place]. Please call the office at [phone]. ?New and ongoing conditions may require a visit. We have virtual and in person visit available for your convenience.  ?Complex MyChart concerns may require a visit. Your provider may request you schedule a virtual or in person visit to ensure we are providing the best care possible. ?MyChart messages sent  after 11:00 AM on Friday will not be received by the provider until Monday morning.  ?  ?Lab and Test Results: ?You will receive your lab and test results on MyChart as soon as they are completed and results have been sent by the lab or testing facility. Due to this service, you will receive your results BEFORE your provider.  ?I review lab and tests results each morning prior to seeing patients. Some results require collaboration with other providers to ensure you are receiving the most appropriate care. For this reason, we ask that you please allow a minimum of 3-5 business days from the time the ALL results have been received for your provider to receive and review lab and test results and contact you about these.  ?Most lab and test result comments from the provider will be sent through [place]. Your provider may recommend changes to the plan of care, follow-up visits, repeat testing, ask questions, or request an office visit to discuss these results. You may reply directly to this message or call the office at [phone] to provide information for the provider or set up an appointment. ?In some instances, you will be called with test results and recommendations. Please let us know if this is preferred and we will make note of this in your chart to provide this for you.    ?If you have not heard a response to your lab or test results in 5 business days from all results returning to [place], please call the office to let us know. We ask that you please avoid calling prior to this time unless there is an emergent concern. Due to high call volumes, this can delay the resulting process. ? ?After Hours: ?For all non-emergency after hours needs, please call the office at [phone] and select the option to reach the on-call provider service. On-call services are shared between multiple [place] offices and therefore it will not be possible to speak directly with your provider. On-call providers may provide  medical advice and recommendations, but are unable to provide refills for maintenance medications.  ?For all emergency or urgent medical needs after normal business hours, we recommend that you seek care at the closest Urgent Care or Emergency Department to ensure appropriate treatment in a timely manner.  ?[facility] has a 24 hour emergency room located on the ground floor for your convenience.  ? ?Urgent Concerns During the Business Day ?Providers are seeing patients from 8AM to [place] with a busy schedule and are most often not able to respond to non-urgent calls until the end of the day or the next business day. ?If you should have URGENT concerns during the day, please call and speak to the nurse or schedule a same day appointment so that we can address your concern without delay.  ? ?Thank you, again, for choosing me as your health care partner. I appreciate your trust and look forward to learning more about you.  ? ?Worthy Keeler, DNP, AGNP-c ?___________________________________________________________ ? ?Health Maintenance  Recommendations ?Screening Testing ?Mammogram ?Every 1 -2 years based on history and risk factors ?Starting at age 45 ?Pap Smear ?Ages 21-39 every 3 years ?Ages 37-65 every 5 years with HPV testing ?More frequent testing may be required based on results and history ?Colon Cancer Screening ?Every 1-10 years based on test performed, risk factors, and history ?Starting at age 76 ?Bone Density Screening ?Every 2-10 years based on history ?Starting at age 4 for women ?Recommendations for men differ based on medication usage, history, and risk factors ?AAA Screening ?One time ultrasound ?Men 75-32 years old who have every smoked ?Lung Cancer Screening ?Low Dose Lung CT every 12 months ?Age 25-80 years with a 30 pack-year smoking history who still smoke or who have quit within the last 15 years ? ?Screening Labs ?Routine  Labs: Complete Blood Count (CBC), Complete Metabolic  Panel (CMP), Cholesterol (Lipid Panel) ?Every 6-12 months based on history and medications ?May be recommended more frequently based on current conditions or previous results ?Hemoglobin A1c Lab ?Every 3-12 m

## 2021-12-16 NOTE — Assessment & Plan Note (Signed)
Difficulty falling and staying asleep with previous use of trazodone with success.  ?Patient is no longer pregnant or breast feeding and is on appropriate contraceptive management with no plans of conceiving in the near future.  ?Will plan to restart trazodone 50mg  qHS and monitor for improvement.  ?F/U if sx worsen or fail to improve.  ?

## 2021-12-17 LAB — CBC WITH DIFFERENTIAL/PLATELET
Basophils Absolute: 0.1 10*3/uL (ref 0.0–0.2)
Basos: 1 %
EOS (ABSOLUTE): 0.1 10*3/uL (ref 0.0–0.4)
Eos: 1 %
Hematocrit: 37.4 % (ref 34.0–46.6)
Hemoglobin: 12.1 g/dL (ref 11.1–15.9)
Immature Grans (Abs): 0 10*3/uL (ref 0.0–0.1)
Immature Granulocytes: 0 %
Lymphocytes Absolute: 2.3 10*3/uL (ref 0.7–3.1)
Lymphs: 34 %
MCH: 28.9 pg (ref 26.6–33.0)
MCHC: 32.4 g/dL (ref 31.5–35.7)
MCV: 90 fL (ref 79–97)
Monocytes Absolute: 0.6 10*3/uL (ref 0.1–0.9)
Monocytes: 8 %
Neutrophils Absolute: 3.7 10*3/uL (ref 1.4–7.0)
Neutrophils: 56 %
Platelets: 345 10*3/uL (ref 150–450)
RBC: 4.18 x10E6/uL (ref 3.77–5.28)
RDW: 12.5 % (ref 11.7–15.4)
WBC: 6.7 10*3/uL (ref 3.4–10.8)

## 2021-12-17 LAB — COMPREHENSIVE METABOLIC PANEL
ALT: 15 IU/L (ref 0–32)
AST: 21 IU/L (ref 0–40)
Albumin/Globulin Ratio: 1.7 (ref 1.2–2.2)
Albumin: 4.6 g/dL (ref 3.8–4.8)
Alkaline Phosphatase: 50 IU/L (ref 44–121)
BUN/Creatinine Ratio: 19 (ref 9–23)
BUN: 18 mg/dL (ref 6–24)
Bilirubin Total: 0.2 mg/dL (ref 0.0–1.2)
CO2: 23 mmol/L (ref 20–29)
Calcium: 9.6 mg/dL (ref 8.7–10.2)
Chloride: 102 mmol/L (ref 96–106)
Creatinine, Ser: 0.94 mg/dL (ref 0.57–1.00)
Globulin, Total: 2.7 g/dL (ref 1.5–4.5)
Glucose: 95 mg/dL (ref 70–99)
Potassium: 4.2 mmol/L (ref 3.5–5.2)
Sodium: 139 mmol/L (ref 134–144)
Total Protein: 7.3 g/dL (ref 6.0–8.5)
eGFR: 79 mL/min/{1.73_m2} (ref >59)

## 2021-12-17 LAB — LDL CHOLESTEROL, DIRECT: LDL Direct: 72 mg/dL (ref 0–99)

## 2021-12-17 LAB — VITAMIN D 25 HYDROXY (VIT D DEFICIENCY, FRACTURES): Vit D, 25-Hydroxy: 67.3 ng/mL (ref 30.0–100.0)

## 2021-12-17 LAB — TSH: TSH: 1.44 u[IU]/mL (ref 0.450–4.500)

## 2021-12-30 ENCOUNTER — Encounter (HOSPITAL_BASED_OUTPATIENT_CLINIC_OR_DEPARTMENT_OTHER): Payer: Self-pay | Admitting: Nurse Practitioner

## 2022-01-01 ENCOUNTER — Other Ambulatory Visit (HOSPITAL_BASED_OUTPATIENT_CLINIC_OR_DEPARTMENT_OTHER): Payer: Self-pay | Admitting: Nurse Practitioner

## 2022-01-01 DIAGNOSIS — M940 Chondrocostal junction syndrome [Tietze]: Secondary | ICD-10-CM

## 2022-01-01 MED ORDER — CYCLOBENZAPRINE HCL 5 MG PO TABS: 5.0000 mg | ORAL_TABLET | Freq: Every day | ORAL | 1 refills | Status: DC

## 2022-01-01 MED ORDER — MELOXICAM 15 MG PO TABS: 15.0000 mg | ORAL_TABLET | Freq: Every day | ORAL | 2 refills | Status: DC

## 2022-01-08 ENCOUNTER — Other Ambulatory Visit (HOSPITAL_BASED_OUTPATIENT_CLINIC_OR_DEPARTMENT_OTHER): Payer: Self-pay | Admitting: Nurse Practitioner

## 2022-01-08 DIAGNOSIS — R6882 Decreased libido: Secondary | ICD-10-CM

## 2022-01-10 NOTE — Progress Notes (Signed)
?[facility] - Clinic Note ? ?01/14/2022 ?  ? ?CHIEF COMPLAINT ?Patient presents for Retina Evaluation ? ? ?HISTORY OF PRESENT ILLNESS: ?Emily Mccarthy is a 41 y.o. female who presents to the clinic today for:  ? ?HPI   ? ? Retina Evaluation   ?In left eye.  This started 2 weeks ago.  Duration of 2 weeks.  I, the attending physician,  performed the HPI with the patient and updated documentation appropriately. ? ?  ?  ? ? Comments   ?Patient here for retina evaluation. Patient states vision in last 2 weeks OS not right feels getting worse. Woke up one day feels like OS still not healed from lasiks in 2009. When wakes up takes a minuet to focus. Wasn't like that before. No eye pain.  ? ?  ?  ?Last edited by Rennis Chris, MD on 01/15/2022 12:16 AM.  ?  ?Pt states she a couple weeks ago, she woke up and had to "blink and blink" and try to adjust her eyes, she states she had lasik in 2009 and she is wondering if that is starting to wear off, she states she has developed allergies for the first time as well ? ?Referring physician: ?Tollie Eth, NP ?[phone] ?[address] ?[place],  [place] [address] ? ?HISTORICAL INFORMATION:  ? ?Selected notes from the MEDICAL RECORD NUMBER ?Referred by Dr. [place] DeMarco for concern of valsava retinopathy  ? ?CURRENT MEDICATIONS: ?No current outpatient medications on file. (Ophthalmic Drugs)  ? ?No current facility-administered medications for this visit. (Ophthalmic Drugs)  ? ?Current Outpatient Medications (Other)  ?Medication Sig  ? acetaminophen (TYLENOL) 500 MG tablet Take 2 tablets (1,000 mg total) by mouth every 6 (six) hours as needed.  ? buPROPion (WELLBUTRIN XL) 150 MG 24 hr tablet TAKE 1 TABLET BY MOUTH EVERY DAY  ? cyclobenzaprine (FLEXERIL) 5 MG tablet Take 1-2 tablets (5-10 mg total) by mouth at bedtime. As needed for chest wall pain.  ? diclofenac Sodium (VOLTAREN) 1 % GEL Apply 4 g topically 4 (four) times daily as needed.  ? etonogestrel-ethinyl  estradiol (NUVARING) 0.12-0.015 MG/24HR vaginal ring NuvaRing 0.12 mg-0.015 mg/24 hr vaginal ? INSERT 1 VAGINAL RING BY VAGINAL ROUTE AS DIRECTED.  ? ibuprofen (ADVIL) 600 MG tablet Take 1 tablet (600 mg total) by mouth every 6 (six) hours as needed.  ? meloxicam (MOBIC) 15 MG tablet Take 1 tablet (15 mg total) by mouth daily.  ? traZODone (DESYREL) 50 MG tablet Take 1 tablet (50 mg total) by mouth at bedtime.  ? ?No current facility-administered medications for this visit. (Other)  ? ?REVIEW OF SYSTEMS: ?ROS   ?Positive for: Eyes ?Negative for: Constitutional, Gastrointestinal, Neurological, Skin, Genitourinary, Musculoskeletal, HENT, Endocrine, Cardiovascular, Respiratory, Psychiatric, Allergic/Imm, Heme/Lymph ?Last edited by Laddie Aquas, COA on 01/14/2022  9:10 AM.  ?  ? ?ALLERGIES ?No Known Allergies ? ?PAST MEDICAL HISTORY ?Past Medical History:  ?Diagnosis Date  ? Gestational hypertension 06/11/2020  ? Left elbow pain 06/24/2011  ? Neck muscle spasm 03/05/2017  ? Nicotine addiction 06/13/2012  ? Pregnancy induced hypertension   ? Right wrist injury 06/17/2011  ? ?Past Surgical History:  ?Procedure Laterality Date  ? NO PAST SURGERIES    ? ?FAMILY HISTORY ?Family History  ?Problem Relation Age of Onset  ? Heart attack Father   ? Sudden death Neg Hx   ? ?SOCIAL HISTORY ?Social History  ? ?Tobacco Use  ? Smoking status: Former  ?  Packs/day:  0.50  ?  Types: Cigarettes  ? Smokeless tobacco: Never  ?Vaping Use  ? Vaping Use: Never used  ?Substance Use Topics  ? Alcohol use: Not Currently  ?  Comment: up to 3 glasses wine a week  ? Drug use: No  ?  ? ?  ?OPHTHALMIC EXAM: ? ?Base Eye Exam   ? ? Visual Acuity (Snellen - Linear)   ? ?   Right Left  ? Dist [place] 20/25 -1 20/25  ? Dist ph [place] 20/20 20/20  ? ?  ?  ? ? Tonometry (Tonopen, 9:09 AM)   ? ?   Right Left  ? Pressure 11 11  ? ?  ?  ? ? Pupils   ? ?   Dark Light Shape React APD  ? Right 4 3 Round Brisk None  ? Left 4 3 Round Brisk None  ? ?  ?  ? ? Visual Fields  (Counting fingers)   ? ?   Left Right  ?  Full Full  ? ?  ?  ? ? Extraocular Movement   ? ?   Right Left  ?  Full, Ortho Full, Ortho  ? ?  ?  ? ? Neuro/Psych   ? ? Oriented x3: Yes  ? Mood/Affect: Normal  ? ?  ?  ? ? Dilation   ? ? Both eyes: 1.0% Mydriacyl, 2.5% Phenylephrine @ 9:08 AM  ? ?  ?  ? ?  ? ?Slit Lamp and Fundus Exam   ? ? Slit Lamp Exam   ? ?   Right Left  ? Lids/Lashes Normal Normal  ? Conjunctiva/Sclera White and quiet White and quiet  ? Cornea well healed lasik flap, trace PEE well healed lasik flap  ? Anterior Chamber Deep and quiet Deep and quiet  ? Iris Round and dilated Round and dilated  ? Lens Clear Clear  ? Anterior Vitreous trace Vitreous syneresis trace Vitreous syneresis, no pigment  ? ?  ?  ? ? Fundus Exam   ? ?   Right Left  ? Disc Pink and Sharp Pink and Sharp  ? C/D Ratio 0.5 0.55  ? Macula Flat, Good foveal reflex, No heme or edema Good foveal reflex; full resolution of inter retinal blot hemes, no edema  ? Vessels Normal mild tortuousity, mild attenuation  ? Periphery Attached, focal pigment clump at 0900, peripheral cystoid degeneration Attached, no heme, mild peripheral cystoid degeneration  ? ?  ?  ? ?  ? ?Refraction   ? ? Manifest Refraction   ? ?   Sphere Cylinder Axis Dist [facility]  ? Right +0.50 +0.25 013 20/20-1  ? Left +0.50 Sphere  20/20-2  ? ?  ?  ? ?  ? ?IMAGING AND PROCEDURES  ?Imaging and Procedures for 01/14/2022 ? ?OCT, Retina - OU - Both Eyes   ? ?   ?Right Eye ?Quality was good. Central Foveal Thickness: 272. Progression has been stable. Findings include normal foveal contour, no IRF, no SRF, vitreomacular adhesion .  ? ?Left Eye ?Quality was good. Central Foveal Thickness: 258. Progression has improved. Findings include normal foveal contour, no IRF, no SRF, outer retinal atrophy, vitreomacular adhesion (stable improvement in [facility] and retinal edema, interval improvement in ellispsoid signal -- fully healed).  ? ?Notes ?*Images captured and stored on drive ? ?Diagnosis /  Impression:  ?OD: NFP, no IRF/SRF ?OS: stable improvement in [facility] and retinal edema, interval improvement in ellipsoid signal -- fully healed ? ?Clinical management:  ?  See below ? ?Abbreviations: NFP - Normal foveal profile. CME - cystoid macular edema. PED - pigment epithelial detachment. IRF - intraretinal fluid. SRF - subretinal fluid. EZ - ellipsoid zone. ERM - epiretinal membrane. ORA - outer retinal atrophy. ORT - outer retinal tubulation. SRHM - subretinal hyper-reflective material. IRHM - intraretinal hyper-reflective material ? ? ?  ?  ?  ? ?  ?ASSESSMENT/PLAN: ? ?  ICD-10-CM   ?1. Retinal hemorrhage of left eye  H35.62 OCT, Retina - OU - Both Eyes  ?  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  ?3. Hypertensive retinopathy of left eye  H35.032 OCT, Retina - OU - Both Eyes  ?  ?4. Gestational hypertension, third trimester  O13.3   ?  ?5. Pre-eclampsia in third trimester  O14.93   ?  ?6. Hx of LASIK  Z98.890   ?  ? ?1-5. History of Retinal hemorrhages OS -- resolved ? - pt reported blurry vision during childbirth on Thursday, June 14, 2020 ? - underwent emergent induction / vaginal delivery due to pre-eclampsia ? - BP now normal; peak BP ~170s / 110s ? - dilated exam shows stable resolution of disc and retinal hemorrhages OS ? - likely combination of valsalva retinopathy + hypertensive retinopathy (pre-eclampsia) ? - no recurrence or other concerning episodes or findings on repeat exam and imaging ? - can f/u here PRN ? ?6. History of LASIK ? - Dr. Nile RiggsShapiro, 2009 ? - [facility] 20/25 OU from 20/20 OU in Jan 2022 ? - pt reports subjective worsening of vision OU ? - recommend refractive evaluation w/ Dr. Delaney MeigsStonecipher to discuss possible options for visual improvement ? ? ?Ophthalmic Meds Ordered this visit:  ?No orders of the defined types were placed in this encounter. ?  ? ?Return if symptoms worsen or fail to improve. ? ?There are no Patient Instructions on file for this visit. ? ? ?Explained the diagnoses, plan,  and follow up with the patient and they expressed understanding.  Patient expressed understanding of the importance of proper follow up care.  ? ?This document serves as a record of services personally perfo

## 2022-01-14 ENCOUNTER — Encounter (INDEPENDENT_AMBULATORY_CARE_PROVIDER_SITE_OTHER): Payer: Self-pay | Admitting: Ophthalmology

## 2022-01-14 ENCOUNTER — Ambulatory Visit (INDEPENDENT_AMBULATORY_CARE_PROVIDER_SITE_OTHER): Payer: 59 | Admitting: Ophthalmology

## 2022-01-14 DIAGNOSIS — Z9889 Other specified postprocedural states: Secondary | ICD-10-CM

## 2022-01-14 DIAGNOSIS — H3562 Retinal hemorrhage, left eye: Secondary | ICD-10-CM | POA: Diagnosis not present

## 2022-01-14 DIAGNOSIS — O133 Gestational [pregnancy-induced] hypertension without significant proteinuria, third trimester: Secondary | ICD-10-CM

## 2022-01-14 DIAGNOSIS — H3500 Unspecified background retinopathy: Secondary | ICD-10-CM

## 2022-01-14 DIAGNOSIS — H35032 Hypertensive retinopathy, left eye: Secondary | ICD-10-CM | POA: Diagnosis not present

## 2022-01-14 DIAGNOSIS — I10 Essential (primary) hypertension: Secondary | ICD-10-CM | POA: Diagnosis not present

## 2022-01-14 DIAGNOSIS — O1493 Unspecified pre-eclampsia, third trimester: Secondary | ICD-10-CM

## 2022-01-15 ENCOUNTER — Encounter (INDEPENDENT_AMBULATORY_CARE_PROVIDER_SITE_OTHER): Payer: Self-pay | Admitting: Ophthalmology

## 2022-02-13 ENCOUNTER — Encounter (HOSPITAL_BASED_OUTPATIENT_CLINIC_OR_DEPARTMENT_OTHER): Payer: Self-pay | Admitting: Nurse Practitioner

## 2022-02-13 DIAGNOSIS — G4709 Other insomnia: Secondary | ICD-10-CM

## 2022-02-13 MED ORDER — TRAZODONE HCL 100 MG PO TABS: 100.0000 mg | ORAL_TABLET | Freq: Every day | ORAL | 3 refills | Status: DC

## 2022-04-09 ENCOUNTER — Other Ambulatory Visit (HOSPITAL_BASED_OUTPATIENT_CLINIC_OR_DEPARTMENT_OTHER): Payer: Self-pay | Admitting: Nurse Practitioner

## 2022-04-09 DIAGNOSIS — M940 Chondrocostal junction syndrome [Tietze]: Secondary | ICD-10-CM

## 2023-03-08 ENCOUNTER — Other Ambulatory Visit (HOSPITAL_BASED_OUTPATIENT_CLINIC_OR_DEPARTMENT_OTHER): Payer: Self-pay | Admitting: Nurse Practitioner

## 2023-03-08 DIAGNOSIS — G4709 Other insomnia: Secondary | ICD-10-CM

## 2023-03-10 ENCOUNTER — Telehealth: Payer: Self-pay

## 2023-03-10 DIAGNOSIS — G4709 Other insomnia: Secondary | ICD-10-CM

## 2023-03-10 NOTE — Telephone Encounter (Signed)
Pt. Called back to schedule a med check appointment because I sent her a message that it has been over a year since her last appointment. She needs Trazodone refilled and also wanted to get back on her Adderall 30 mg 1 daily she had stopped it while she was pregnant. Last apt was 12/16/21 and next apt is 04/08/23.

## 2023-03-18 MED ORDER — TRAZODONE HCL 100 MG PO TABS: 100.0000 mg | ORAL_TABLET | Freq: Every day | ORAL | 3 refills | Status: DC

## 2023-03-18 NOTE — Telephone Encounter (Signed)
I sent the order for Trazodone for her. Due to controlled substance laws and safety, I am unable to start any controlled substance medications until she has been seen. We can discuss ADHD and previous treatments at her appointment, though.

## 2023-03-18 NOTE — Addendum Note (Signed)
Addended by: Ramisa Duman, [place] Dec E on: 03/18/2023 10:56 AM   Modules accepted: Orders

## 2023-04-08 ENCOUNTER — Encounter: Payer: Self-pay | Admitting: Nurse Practitioner

## 2023-04-08 ENCOUNTER — Ambulatory Visit: Payer: 59 | Admitting: Nurse Practitioner

## 2023-04-08 VITALS — BP 122/80 | HR 88 | Wt 178.8 lb

## 2023-04-08 DIAGNOSIS — F988 Other specified behavioral and emotional disorders with onset usually occurring in childhood and adolescence: Secondary | ICD-10-CM | POA: Diagnosis not present

## 2023-04-08 DIAGNOSIS — G4709 Other insomnia: Secondary | ICD-10-CM

## 2023-04-08 MED ORDER — AMPHETAMINE-DEXTROAMPHETAMINE 15 MG PO TABS
15.0000 mg | ORAL_TABLET | Freq: Two times a day (BID) | ORAL | 0 refills | Status: AC
Start: 2023-06-03 — End: ?

## 2023-04-08 MED ORDER — AMPHETAMINE-DEXTROAMPHETAMINE 15 MG PO TABS
15.0000 mg | ORAL_TABLET | Freq: Two times a day (BID) | ORAL | 0 refills | Status: DC
Start: 2023-05-06 — End: 2023-06-09

## 2023-04-08 MED ORDER — TRAZODONE HCL 100 MG PO TABS
100.0000 mg | ORAL_TABLET | Freq: Every day | ORAL | 3 refills | Status: DC
Start: 2023-04-08 — End: 2023-08-14

## 2023-04-08 MED ORDER — AMPHETAMINE-DEXTROAMPHETAMINE 15 MG PO TABS
15.0000 mg | ORAL_TABLET | Freq: Two times a day (BID) | ORAL | 0 refills | Status: AC
Start: 2023-04-08 — End: ?

## 2023-04-08 NOTE — Patient Instructions (Signed)
It was wonderful to see you! If you have any issues with the medication not working as well, please let me know.   I can only send in 3 months at a time, but you will have this available at the pharmacy. When you pick up your last prescription let me know and I will send in the next 3 months.

## 2023-04-08 NOTE — Progress Notes (Signed)
  Shawna Clamp, DNP, AGNP-c [facility]  [address] [place], [place] [address] [phone]  ESTABLISHED PATIENT- Chronic Health and/or Follow-Up Visit  Blood pressure 122/80, pulse 88, weight 178 lb 12.8 oz (81.1 kg), unknown if currently breastfeeding.    Emily Mccarthy is a 42 y.o. year old female presenting today for evaluation and management of chronic conditions.   Emily Mccarthy presents today with a history of ADHD and request to reinitiate Adderall for management of this.  She reports previously taking 30 mg once daily but expresses a preference to start at a lower dosage initially and determine if the higher dosages needed.  She has been splitting her remaining pills to gradually reintroduce the medication, avoiding an abrupt return to the 30 mg dose.  She feels that the current dose of 15 mg is working well for her but she remains open to adjustments if deemed necessary after some time on the medication.  She is currently using trazodone for sleep management and requests a refill on this.  This is working well for her without any concerns.   All ROS negative with exception of what is listed above.   PHYSICAL EXAM Physical Exam Vitals and nursing note reviewed.  Constitutional:      Appearance: Normal appearance.  HENT:     Head: Normocephalic.  Eyes:     Pupils: Pupils are equal, round, and reactive to light.  Cardiovascular:     Rate and Rhythm: Normal rate and regular rhythm.     Pulses: Normal pulses.     Heart sounds: Normal heart sounds.  Pulmonary:     Effort: Pulmonary effort is normal.     Breath sounds: Normal breath sounds.  Musculoskeletal:        General: Normal range of motion.     Cervical back: Normal range of motion.  Skin:    General: Skin is warm.  Neurological:     General: No focal deficit present.     Mental Status: She is alert and oriented to person, place, and time.  Psychiatric:        Mood and Affect: Mood normal.      PLAN Problem List Items Addressed This Visit     Attention deficit disorder - Primary    Struggle with focus and concentration with a history of ADHD managed with Adderall 30 mg.  At this time I feel it is reasonable to restart the medication.  Will plan to restart at 15 mg Adderall short acting formula twice daily and monitor closely for adjustment in dose as needed.  Will plan to follow-up in 3 months or sooner if needed.      Relevant Medications   amphetamine-dextroamphetamine (ADDERALL) 15 MG tablet (Start on 06/03/2023)   amphetamine-dextroamphetamine (ADDERALL) 15 MG tablet   amphetamine-dextroamphetamine (ADDERALL) 15 MG tablet   Other insomnia    Chronic insomnia currently well-managed with trazodone.  Refills provided today.      Relevant Medications   traZODone (DESYREL) 100 MG tablet    Return in about 6 months (around 10/09/2023) for Med Management 30.  Time: 20 minutes, >50% spent counseling, care coordination, chart review, and documentation.   Shawna Clamp, DNP, AGNP-c

## 2023-04-13 ENCOUNTER — Telehealth: Payer: Self-pay | Admitting: Nurse Practitioner

## 2023-04-13 NOTE — Telephone Encounter (Signed)
PA Case ID #: [id number] Rx #: [id number] Need Help? Call us at [phone] Outcome Approved today Your PA request has been approved. Additional information will be provided in the approval communication. (Message 1145) Authorization Expiration Date: 04/12/2026 Drug Amphetamine-Dextroamphetamine 15MG  tablets ePA cloud Psychologist, educational Electronic PA Form [phone] [place])

## 2023-05-12 NOTE — Assessment & Plan Note (Signed)
Struggle with focus and concentration with a history of ADHD managed with Adderall 30 mg.  At this time I feel it is reasonable to restart the medication.  Will plan to restart at 15 mg Adderall short acting formula twice daily and monitor closely for adjustment in dose as needed.  Will plan to follow-up in 3 months or sooner if needed.

## 2023-05-12 NOTE — Assessment & Plan Note (Signed)
Chronic insomnia currently well-managed with trazodone.  Refills provided today.

## 2023-06-09 ENCOUNTER — Other Ambulatory Visit: Payer: Self-pay | Admitting: Nurse Practitioner

## 2023-06-09 DIAGNOSIS — F988 Other specified behavioral and emotional disorders with onset usually occurring in childhood and adolescence: Secondary | ICD-10-CM

## 2023-06-09 NOTE — Telephone Encounter (Signed)
Last apt 04/08/23 should still have a refill at the pharmacy from 06/03/23

## 2023-06-11 ENCOUNTER — Other Ambulatory Visit (HOSPITAL_BASED_OUTPATIENT_CLINIC_OR_DEPARTMENT_OTHER): Payer: Self-pay

## 2023-06-11 MED ORDER — AMPHETAMINE-DEXTROAMPHETAMINE 15 MG PO TABS
15.0000 mg | ORAL_TABLET | Freq: Two times a day (BID) | ORAL | 0 refills | Status: AC
Start: 2023-06-11 — End: ?
  Filled 2023-06-11 – 2023-09-04 (×4): qty 60, 30d supply, fill #0

## 2023-06-12 ENCOUNTER — Other Ambulatory Visit (HOSPITAL_BASED_OUTPATIENT_CLINIC_OR_DEPARTMENT_OTHER): Payer: Self-pay

## 2023-06-23 ENCOUNTER — Other Ambulatory Visit (HOSPITAL_BASED_OUTPATIENT_CLINIC_OR_DEPARTMENT_OTHER): Payer: Self-pay

## 2023-08-14 ENCOUNTER — Other Ambulatory Visit: Payer: Self-pay | Admitting: Nurse Practitioner

## 2023-08-14 DIAGNOSIS — G4709 Other insomnia: Secondary | ICD-10-CM

## 2023-08-14 MED ORDER — TRAZODONE HCL 50 MG PO TABS
100.0000 mg | ORAL_TABLET | Freq: Every day | ORAL | 3 refills | Status: AC
Start: 2023-08-14 — End: ?

## 2023-08-24 ENCOUNTER — Other Ambulatory Visit (HOSPITAL_BASED_OUTPATIENT_CLINIC_OR_DEPARTMENT_OTHER): Payer: Self-pay

## 2023-09-03 ENCOUNTER — Other Ambulatory Visit (HOSPITAL_BASED_OUTPATIENT_CLINIC_OR_DEPARTMENT_OTHER): Payer: Self-pay

## 2023-09-04 ENCOUNTER — Other Ambulatory Visit (HOSPITAL_BASED_OUTPATIENT_CLINIC_OR_DEPARTMENT_OTHER): Payer: Self-pay

## 2023-10-15 ENCOUNTER — Encounter: Payer: 59 | Admitting: Nurse Practitioner

## 2024-05-08 ENCOUNTER — Other Ambulatory Visit: Payer: Self-pay | Admitting: Nurse Practitioner

## 2024-05-08 DIAGNOSIS — G4709 Other insomnia: Secondary | ICD-10-CM

## 2024-05-26 ENCOUNTER — Other Ambulatory Visit: Payer: Self-pay | Admitting: Nurse Practitioner

## 2024-05-26 DIAGNOSIS — G4709 Other insomnia: Secondary | ICD-10-CM
